# Patient Record
Sex: Male | Born: 1990
Health system: Southern US, Community
[De-identification: ages and names within clinical notes are randomized; demographics above are authoritative.]

## PROBLEM LIST (undated history)

## (undated) DIAGNOSIS — F909 Attention-deficit hyperactivity disorder, unspecified type: Secondary | ICD-10-CM

## (undated) DIAGNOSIS — F329 Major depressive disorder, single episode, unspecified: Secondary | ICD-10-CM

## (undated) DIAGNOSIS — K649 Unspecified hemorrhoids: Secondary | ICD-10-CM

## (undated) DIAGNOSIS — F419 Anxiety disorder, unspecified: Secondary | ICD-10-CM

## (undated) DIAGNOSIS — F32A Depression, unspecified: Secondary | ICD-10-CM

## (undated) HISTORY — DX: Depression, unspecified: F32.A

## (undated) HISTORY — PX: TYMPANOSTOMY TUBE PLACEMENT: SHX32

## (undated) HISTORY — DX: Major depressive disorder, single episode, unspecified: F32.9

## (undated) HISTORY — DX: Anxiety disorder, unspecified: F41.9

## (undated) HISTORY — PX: EYE SURGERY: SHX253

## (undated) HISTORY — PX: ADENOIDECTOMY: SUR15

## (undated) HISTORY — DX: Attention-deficit hyperactivity disorder, unspecified type: F90.9

---

## 2015-12-14 ENCOUNTER — Ambulatory Visit: Payer: Self-pay | Admitting: Family Medicine

## 2015-12-14 DIAGNOSIS — E669 Obesity, unspecified: Secondary | ICD-10-CM | POA: Insufficient documentation

## 2015-12-14 NOTE — Progress Notes (Deleted)
        New patient office visit note:  Impression and Recommendations:    No diagnosis found.   ***  No problem-specific Assessment & Plan notes found for this encounter.      No orders of the defined types were placed in this encounter.   New Prescriptions   No medications on file    Modified Medications   No medications on file    Discontinued Medications   No medications on file    No Follow-up on file.  The patient was counseled, risk factors were discussed, anticipatory guidance given.  Gross side effects, risk and benefits, and alternatives of medications discussed with patient.  Patient is aware that all medications have potential side effects and we are unable to predict every side effect or drug-drug interaction that may occur.  Expresses verbal understanding and consents to current therapy plan and treatment regimen.  Please see AVS handed out to patient at the end of our visit for further patient instructions/ counseling done pertaining to today's office visit.    Note: This document was prepared using Dragon voice recognition software and may include unintentional dictation errors.  ----------------------------------------------------------------------------------------------------------------------    Subjective:    No chief complaint on file.   HPI: Scott Mitchell is a pleasant 25 y.o. male who [facility] today to review their medical history with me and establish care.   I asked the patient to review their chronic problem list with me to ensure everything was updated and accurate.     ***    Wt Readings from Last 3 Encounters:  No data found for Wt   BP Readings from Last 3 Encounters:  No data found for BP   Pulse Readings from Last 3 Encounters:  No data found for Pulse   BMI Readings from Last 3 Encounters:  No data found for BMI    There are no active problems to display for this  patient.    No past medical history on file.   No past surgical history on file.   No family history on file.   History  Drug use: Unknown    History  Alcohol use Not on file    History  Smoking Status  . Not on file  Smokeless Tobacco  . Not on file    Patient's Medications   No medications on file    Allergies: Review of patient's allergies indicates not on file.  ROS   Objective:   There were no vitals taken for this visit. There is no height or weight on file to calculate BMI. General: Well Developed, well nourished, and in no acute distress.  Neuro: Alert and oriented x3, extra-ocular muscles intact, sensation grossly intact.  HEENT: Normocephalic, atraumatic, pupils equal round reactive to light, neck supple Skin: no gross suspicious lesions or rashes  Cardiac: Regular rate and rhythm, no murmurs rubs or gallops.  Respiratory: Essentially clear to auscultation bilaterally. Not using accessory muscles, speaking in full sentences.  Abdominal: Soft, not grossly distended Musculoskeletal: Ambulates w/o diff, FROM * 4 ext.  Vasc: less 2 sec cap RF, warm and pink  Psych:  No HI/SI, judgement and insight good, Euthymic mood. Full Affect.

## 2015-12-26 DIAGNOSIS — F331 Major depressive disorder, recurrent, moderate: Secondary | ICD-10-CM | POA: Insufficient documentation

## 2017-03-23 ENCOUNTER — Encounter (HOSPITAL_BASED_OUTPATIENT_CLINIC_OR_DEPARTMENT_OTHER): Payer: Self-pay | Admitting: Emergency Medicine

## 2017-03-23 ENCOUNTER — Other Ambulatory Visit: Payer: Self-pay

## 2017-03-23 ENCOUNTER — Emergency Department (HOSPITAL_BASED_OUTPATIENT_CLINIC_OR_DEPARTMENT_OTHER)
Admission: EM | Admit: 2017-03-23 | Discharge: 2017-03-23 | Disposition: A | Discharge status: Home / Self Care | Payer: 59 | Attending: Emergency Medicine | Admitting: Emergency Medicine

## 2017-03-23 DIAGNOSIS — K644 Residual hemorrhoidal skin tags: Secondary | ICD-10-CM | POA: Diagnosis not present

## 2017-03-23 DIAGNOSIS — K649 Unspecified hemorrhoids: Secondary | ICD-10-CM | POA: Diagnosis not present

## 2017-03-23 DIAGNOSIS — K6289 Other specified diseases of anus and rectum: Secondary | ICD-10-CM | POA: Diagnosis not present

## 2017-03-23 HISTORY — DX: Unspecified hemorrhoids: K64.9

## 2017-03-23 MED ORDER — IBUPROFEN 800 MG PO TABS
800.0000 mg | ORAL_TABLET | Freq: Three times a day (TID) | ORAL | 0 refills | Status: DC
Start: 2017-03-23 — End: 2021-07-02

## 2017-03-23 MED ORDER — LIDOCAINE-HYDROCORTISONE ACE 3-1 % RE KIT: 1.0000 "application " | PACK | Freq: Two times a day (BID) | RECTAL | 0 refills | Status: DC

## 2017-03-23 NOTE — Discharge Instructions (Signed)
You were seen in the emergency department and found to have a hemorrhoid on your physical exam. We have prescribed you two medications today:   - Anamantle- this is topical cream that has lidocaine and hydrocortisone in it. Lidocaine is a topical numbing medicine. Hydrocortisone is a topical steroid. These medications will help with your discomfort.   - Ibuprofen 800mg - this is a nonsteroidal anti-inflammatory medication will help with pain and swelling.  You may take this once every 8 hours as needed for pain.  Be sure to take this with food as it can cause stomach upset, and at worst stomach bleeding.  I have given you contact information for general surgeon office, call today or tomorrow in order to make an appointment within the next couple of days.  This appointment will be for reevaluation and to see if there is further intervention needed for your hemorrhoid.  To the emergency department for any new or worsening symptoms or any other concerns.

## 2017-03-23 NOTE — ED Provider Notes (Signed)
[facility] EMERGENCY DEPARTMENT Provider Note   CSN: [id number] Arrival date & time: 03/23/17  5726   History   Chief Complaint Chief Complaint  Patient presents with  . Rectal Pain    HPI Scott Mitchell is a 27 y.o. male with a hx of hemorrhoids who presents to the ED with complaint of constant rectal pain for the past 5 days. Pain has been persistent, not necessarily worsening. States that pain is worse following BM, slightly improved with SITZ baths. No other alleviating/aggravating factors. Has hx of similar, has not required surgical intervention. Denies abdominal pain, blood in stool, diarrhea, constipation, nausea, or vomiting. No fever or chills.  HPI  Past Medical History:  Diagnosis Date  . Hemorrhoids     There are no active problems to display for this patient.   History reviewed. No pertinent surgical history.    Home Medications    Prior to Admission medications   Medication Sig Start Date End Date Taking? Authorizing Provider  shark liver oil-cocoa butter (PREPARATION H) 0.25-3-85.5 % suppository Place 1 suppository rectally as needed for hemorrhoids.   Yes [provider]    Family History History reviewed. No pertinent family history.  Social History Social History   Tobacco Use  . Smoking status: Never Smoker  . Smokeless tobacco: Never Used  Substance Use Topics  . Alcohol use: Not on file  . Drug use: Not on file     Allergies   Patient has no known allergies.   Review of Systems Review of Systems  Constitutional: Negative for chills and fever.  Gastrointestinal: Positive for rectal pain. Negative for abdominal pain, blood in stool, constipation, diarrhea, nausea and vomiting.     Physical Exam Updated Vital Signs BP 139/79 (BP Location: Right Arm)   Pulse (!) 56   Temp 98.1 F (36.7 C) (Oral)   Resp 18   Ht 6' (1.829 m)   Wt 106.6 kg (235 lb)   SpO2 100%   BMI 31.87 kg/m   Physical Exam  Constitutional:  He appears well-developed and well-nourished. No distress.  HENT:  Head: Normocephalic and atraumatic.  Eyes: Conjunctivae are normal. Right eye exhibits no discharge. Left eye exhibits no discharge.  Abdominal: Soft. He exhibits no distension. There is no tenderness. There is no rebound and no guarding.  Genitourinary: Rectal exam shows external hemorrhoid (12:00 position that is 1.5cm size, the hemorrhoid is somewhat firm to palaption, flesh colored. There is tenderness to palpation).  Genitourinary Comments: RN Elba Barman present as chaperone.   Neurological: He is alert.  Clear speech.   Skin: Skin is warm and dry.  Psychiatric: He has a normal mood and affect. His behavior is normal. Thought content normal.  Nursing note and vitals reviewed.   ED Treatments / Results  Labs (all labs ordered are listed, but only abnormal results are displayed) Labs Reviewed - No data to display  EKG  EKG Interpretation None      Radiology No results found.  Procedures Procedures (including critical care time)  Medications Ordered in ED Medications - No data to display  Initial Impression / Assessment and Plan / ED Course  I have reviewed the triage vital signs and the nursing notes.  Pertinent labs & imaging results that were available during my care of the patient were reviewed by me and considered in my medical decision making (see chart for details).  Patient presents with hemorrhoid on exam. He is nontoxic appearing, vitals WNL. Hemorrhoid is tender to  palpation, there is concern for thrombosis. Will treat with topical hydrocortisone/lidocaine as well as ibuprofen. Discussed continued Sitz baths. Instructed patient to call general surgery today or tomorrow to make an appointment within the next 48 hours for re-evaluation and further management. I discussed treatment plan, need for close general surgery follow-up, and return precautions with the patient. Provided opportunity for questions,  patient confirmed understanding and is in agreement with plan.   Findings and plan of care discussed with supervising physician Dr. Sherry Ruffing who is in agreement.   Final Clinical Impressions(s) / ED Diagnoses   Final diagnoses:  Hemorrhoids, unspecified hemorrhoid type    ED Discharge Orders        Ordered    lidocaine-hydrocortisone (ANAMANTLE) 3-1 % KIT  2 times daily     03/23/17 1525    ibuprofen (ADVIL,MOTRIN) 800 MG tablet  3 times daily     03/23/17 [address] [place] R, PA-C 03/23/17 1842    Tegeler, Gwenyth Allegra, MD 03/23/17 [phone]

## 2017-03-23 NOTE — ED Triage Notes (Signed)
Pt having rectal pain since last Wednesday.  Pt states he has hemorrhoids in the past.  Pt using OTC meds, ice, sitz baths but is not improving.  No fever, no drainage.

## 2017-03-30 DIAGNOSIS — K645 Perianal venous thrombosis: Secondary | ICD-10-CM | POA: Diagnosis not present

## 2017-04-07 ENCOUNTER — Ambulatory Visit (INDEPENDENT_AMBULATORY_CARE_PROVIDER_SITE_OTHER): Payer: 59 | Admitting: Adult Health

## 2017-04-07 ENCOUNTER — Encounter: Payer: Self-pay | Admitting: Adult Health

## 2017-04-07 VITALS — BP 127/84 | HR 64 | Ht 71.5 in | Wt 238.6 lb

## 2017-04-07 DIAGNOSIS — Z8349 Family history of other endocrine, nutritional and metabolic diseases: Secondary | ICD-10-CM | POA: Diagnosis not present

## 2017-04-07 DIAGNOSIS — F419 Anxiety disorder, unspecified: Secondary | ICD-10-CM | POA: Diagnosis not present

## 2017-04-07 DIAGNOSIS — F329 Major depressive disorder, single episode, unspecified: Secondary | ICD-10-CM | POA: Diagnosis not present

## 2017-04-07 DIAGNOSIS — R5383 Other fatigue: Secondary | ICD-10-CM | POA: Diagnosis not present

## 2017-04-07 DIAGNOSIS — F32A Depression, unspecified: Secondary | ICD-10-CM | POA: Insufficient documentation

## 2017-04-07 DIAGNOSIS — Z0000 Encounter for general adult medical examination without abnormal findings: Secondary | ICD-10-CM | POA: Insufficient documentation

## 2017-04-07 NOTE — Assessment & Plan Note (Signed)
PHQ 14 Previously on Sertraline 50mg , he stopped taking Fall 2018 He declined CBT referral or re-starting Rx He reports mood stable, denies thoughts of harming himself/others.

## 2017-04-07 NOTE — Assessment & Plan Note (Signed)
Increase water intake, strive for at least 125 ounces/day.   Follow Heart Healthy diet Increase regular exercise.  Recommend at least 30 minutes daily, 5 days per week of walking, jogging, biking, swimming, YouTube/Pinterest workout videos. Your homework- Hit the kick boxing gym with your co-worker by 15 Feb 19 Meet with [employer] recruiter by 1 Mar 19 Schedule complete physical in the next few months.

## 2017-04-07 NOTE — Patient Instructions (Addendum)
Heart-Healthy Eating Plan Many factors influence your heart health, including eating and exercise habits. Heart (coronary) risk increases with abnormal blood fat (lipid) levels. Heart-healthy meal planning includes limiting unhealthy fats, increasing healthy fats, and making other small dietary changes. This includes maintaining a healthy body weight to help keep lipid levels within a normal range. What is my plan? Your health care provider recommends that you:  Get no more than __25__% of the total calories in your daily diet from fat.  Limit your intake of saturated fat to less than ___5___% of your total calories each day.  Limit the amount of cholesterol in your diet to less than _300__ mg per day.  What types of fat should I choose?  Choose healthy fats more often. Choose monounsaturated and polyunsaturated fats, such as olive oil and canola oil, flaxseeds, walnuts, almonds, and seeds.  Eat more omega-3 fats. Good choices include salmon, mackerel, sardines, tuna, flaxseed oil, and ground flaxseeds. Aim to eat fish at least two times each week.  Limit saturated fats. Saturated fats are primarily found in animal products, such as meats, butter, and cream. Plant sources of saturated fats include palm oil, palm kernel oil, and coconut oil.  Avoid foods with partially hydrogenated oils in them. These contain trans fats. Examples of foods that contain trans fats are stick margarine, some tub margarines, cookies, crackers, and other baked goods. What general guidelines do I need to follow?  Check food labels carefully to identify foods with trans fats or high amounts of saturated fat.  Fill one half of your plate with vegetables and green salads. Eat 4-5 servings of vegetables per day. A serving of vegetables equals 1 cup of raw leafy vegetables,  cup of raw or cooked cut-up vegetables, or  cup of vegetable juice.  Fill one fourth of your plate with whole grains. Look for the word "whole"  as the first word in the ingredient list.  Fill one fourth of your plate with lean protein foods.  Eat 4-5 servings of fruit per day. A serving of fruit equals one medium whole fruit,  cup of dried fruit,  cup of fresh, frozen, or canned fruit, or  cup of 100% fruit juice.  Eat more foods that contain soluble fiber. Examples of foods that contain this type of fiber are apples, broccoli, carrots, beans, peas, and barley. Aim to get 20-30 g of fiber per day.  Eat more home-cooked food and less restaurant, buffet, and fast food.  Limit or avoid alcohol.  Limit foods that are high in starch and sugar.  Avoid fried foods.  Cook foods by using methods other than frying. Baking, boiling, grilling, and broiling are all great options. Other fat-reducing suggestions include: ? Removing the skin from poultry. ? Removing all visible fats from meats. ? Skimming the fat off of stews, soups, and gravies before serving them. ? Steaming vegetables in water or broth.  Lose weight if you are overweight. Losing just 5-10% of your initial body weight can help your overall health and prevent diseases such as diabetes and heart disease.  Increase your consumption of nuts, legumes, and seeds to 4-5 servings per week. One serving of dried beans or legumes equals  cup after being cooked, one serving of nuts equals 1 ounces, and one serving of seeds equals  ounce or 1 tablespoon.  You may need to monitor your salt (sodium) intake, especially if you have high blood pressure. Talk with your health care provider or dietitian to get  more information about reducing sodium. What foods can I eat? Grains  Breads, including [place], white, pita, wheat, raisin, rye, oatmeal, and [place]. Tortillas that are neither fried nor made with lard or trans fat. Low-fat rolls, including hotdog and hamburger buns and English muffins. Biscuits. Muffins. Waffles. Pancakes. Light popcorn. Whole-grain cereals. Flatbread. Melba  toast. Pretzels. Breadsticks. Rusks. Low-fat snacks and crackers, including oyster, saltine, matzo, graham, animal, and rye. Rice and pasta, including brown rice and those that are made with whole wheat. Vegetables All vegetables. Fruits All fruits, but limit coconut. Meats and Other Protein Sources Lean, well-trimmed beef, veal, pork, and lamb. Chicken and [place] without skin. All fish and shellfish. Wild duck, rabbit, pheasant, and venison. Egg whites or low-cholesterol egg substitutes. Dried beans, peas, lentils, and tofu.Seeds and most nuts. Dairy Low-fat or nonfat cheeses, including ricotta, string, and mozzarella. Skim or 1% milk that is liquid, powdered, or evaporated. Buttermilk that is made with low-fat milk. Nonfat or low-fat yogurt. Beverages Mineral water. Diet carbonated beverages. Sweets and Desserts Sherbets and fruit ices. Honey, jam, marmalade, jelly, and syrups. Meringues and gelatins. Pure sugar candy, such as hard candy, jelly beans, gumdrops, mints, marshmallows, and small amounts of dark chocolate. [employer] Eat all sweets and desserts in moderation. Fats and Oils Nonhydrogenated (trans-free) margarines. Vegetable oils, including soybean, sesame, sunflower, olive, peanut, safflower, corn, canola, and cottonseed. Salad dressings or mayonnaise that are made with a vegetable oil. Limit added fats and oils that you use for cooking, baking, salads, and as spreads. Other Cocoa powder. Coffee and tea. All seasonings and condiments. The items listed above may not be a complete list of recommended foods or beverages. Contact your dietitian for more options. What foods are not recommended? Grains Breads that are made with saturated or trans fats, oils, or whole milk. Croissants. Butter rolls. Cheese breads. Sweet rolls. Donuts. Buttered popcorn. Chow mein noodles. High-fat crackers, such as cheese or butter crackers. Meats and Other Protein Sources Fatty meats, such as  hotdogs, short ribs, sausage, spareribs, bacon, ribeye roast or steak, and mutton. High-fat deli meats, such as salami and bologna. Caviar. Domestic duck and goose. Organ meats, such as kidney, liver, sweetbreads, brains, gizzard, chitterlings, and heart. Dairy Cream, sour cream, cream cheese, and creamed cottage cheese. Whole milk cheeses, including blue (bleu), Monterey Jack, [place], [place], American, [place], Swiss, [place], [place], and [place]. Whole or 2% milk that is liquid, evaporated, or condensed. Whole buttermilk. Cream sauce or high-fat cheese sauce. Yogurt that is made from whole milk. Beverages Regular sodas and drinks with added sugar. Sweets and Desserts Frosting. Pudding. Cookies. Cakes other than angel food cake. Candy that has milk chocolate or white chocolate, hydrogenated fat, butter, coconut, or unknown ingredients. Buttered syrups. Full-fat ice cream or ice cream drinks. Fats and Oils Gravy that has suet, meat fat, or shortening. Cocoa butter, hydrogenated oils, palm oil, coconut oil, palm kernel oil. These can often be found in baked products, candy, fried foods, nondairy creamers, and whipped toppings. Solid fats and shortenings, including bacon fat, salt pork, lard, and butter. Nondairy cream substitutes, such as coffee creamers and sour cream substitutes. Salad dressings that are made of unknown oils, cheese, or sour cream. The items listed above may not be a complete list of foods and beverages to avoid. Contact your dietitian for more information. This information is not intended to replace advice given to you by your health care provider. Make sure you discuss any questions you have with your health care  provider. Document Released: 12/11/2007 Document Revised: 09/21/2015 Document Reviewed: 08/25/2013 Elsevier Interactive Patient Education  2018 [employer]  Increase water intake, strive for at least 125 ounces/day.   Follow Heart Healthy diet Increase regular  exercise.  Recommend at least 30 minutes daily, 5 days per week of walking, jogging, biking, swimming, YouTube/Pinterest workout videos. Your homework- Hit the kick boxing gym with your co-worker by 15 Feb 19 Meet with [employer] recruiter by 1 Mar 19 Schedule complete physical in the next few months. WELCOME TO THE [facility]CRICE

## 2017-04-07 NOTE — Progress Notes (Signed)
Subjective:    Patient ID: Scott Mitchell, male    DOB: 1990/03/20, 27 y.o.   MRN: [id number]  HPI:  Scott Mitchell is here to establish as a new pt. He is a pleasant 27 year old male. PMH: Depression/anxiety, PHQ 14 today. He was briefly on Sertraline 88m last year for approx 6 months, last dose was fall 2018. He denies thoughts of harming himself/others and declined CBT referral or re-starting Rx today. He denies thoughts of harming himself/others. He moved [place] 3 years ago and works for [employer] has very few local friends and no family. He feels quite isolated and spends most evenings at home with his new puppy. He denies tobacco/excessive ETOH, he also reports occasional use of marijuana. He has not exercised in months, but he has previously enjoyed road biking, weight lifting, and kick boxing. He reports "I'm all in or nothing" when it comes to diet/exercise. He estimates to have gained 35-40 lbs in the last 1.5 years. He is interested joining [employer] Patient Care Team    Relationship Specialty Notifications Start End  Gibran Veselka, KBerna Spare NP PCP - General Family Medicine  03/19/17     There are no active problems to display for this patient.    Past Medical History:  Diagnosis Date  . Depression   . Hemorrhoids      Past Surgical History:  Procedure Laterality Date  . ADENOIDECTOMY    . EYE SURGERY       Family History  Problem Relation Age of Onset  . Hypothyroidism Mother   . Hypertension Father   . Healthy Sister      Social History   Substance and Sexual Activity  Drug Use Yes  . Types: Marijuana     Social History   Substance and Sexual Activity  Alcohol Use No  . Frequency: Never     Social History   Tobacco Use  Smoking Status Never Smoker  Smokeless Tobacco Never Used     Outpatient Encounter Medications as of 04/07/2017  Medication Sig  . ibuprofen (ADVIL,MOTRIN) 800 MG tablet Take 1 tablet (800 mg total) by mouth 3 (three) times daily.   . [DISCONTINUED] lidocaine-hydrocortisone (ANAMANTLE) 3-1 % KIT Place 1 application rectally 2 (two) times daily.  . [DISCONTINUED] shark liver oil-cocoa butter (PREPARATION H) 0.25-3-85.5 % suppository Place 1 suppository rectally as needed for hemorrhoids.   No facility-administered encounter medications on file as of 04/07/2017.     Allergies: Patient has no known allergies.  Body mass index is 32.81 kg/m.  Blood pressure 127/84, pulse 64, height 5' 11.5" (1.816 m), weight 238 lb 9.6 oz (108.2 kg), SpO2 99 %.     Review of Systems  Constitutional: Positive for fatigue. Negative for activity change, appetite change, chills, diaphoresis, fever and unexpected weight change.  HENT: Negative for congestion.   Eyes: Negative for visual disturbance.  Respiratory: Negative for cough, chest tightness, shortness of breath, wheezing and stridor.   Cardiovascular: Negative for chest pain, palpitations and leg swelling.  Gastrointestinal: Negative for abdominal distention, abdominal pain, blood in stool, constipation, diarrhea, nausea and vomiting.  Endocrine: Negative for cold intolerance, heat intolerance, polydipsia, polyphagia and polyuria.  Genitourinary: Negative for difficulty urinating, flank pain and hematuria.  Musculoskeletal: Negative for arthralgias, back pain, gait problem, joint swelling, myalgias, neck pain and neck stiffness.  Skin: Negative for color change, pallor, rash and wound.  Neurological: Negative for dizziness and headaches.  Hematological: Does not bruise/bleed easily.  Psychiatric/Behavioral: Positive for  sleep disturbance. Negative for decreased concentration, dysphoric mood, hallucinations, self-injury and suicidal ideas. The patient is not nervous/anxious and is not hyperactive.        Objective:   Physical Exam  Constitutional: He is oriented to person, place, and time. He appears well-developed and well-nourished. No distress.  Cardiovascular: Normal  rate, regular rhythm, normal heart sounds and intact distal pulses.  No murmur heard. Pulmonary/Chest: Effort normal and breath sounds normal. No respiratory distress. He has no wheezes. He has no rales. He exhibits no tenderness.  Neurological: He is alert and oriented to person, place, and time.  Skin: Skin is warm and dry. No rash noted. He is not diaphoretic. No erythema. No pallor.  Psychiatric: He has a normal mood and affect. His behavior is normal. Judgment and thought content normal.  Nursing note and vitals reviewed.         Assessment & Plan:   1. Other fatigue   2. Family history of thyroid disease   3. Healthcare maintenance   4. Anxiety and depression     Healthcare maintenance Increase water intake, strive for at least 125 ounces/day.   Follow Heart Healthy diet Increase regular exercise.  Recommend at least 30 minutes daily, 5 days per week of walking, jogging, biking, swimming, YouTube/Pinterest workout videos. Your homework- Hit the kick boxing gym with your co-worker by 15 Feb 19 Meet with [employer] recruiter by 1 Mar 19 Schedule complete physical in the next few months.  Anxiety and depression PHQ 14 Previously on Sertraline 21m, he stopped taking Fall 2018 He declined CBT referral or re-starting Rx He reports mood stable, denies thoughts of harming himself/others.     FOLLOW-UP:  Return in about 3 months (around 07/06/2017) for CPE.

## 2017-04-08 LAB — COMPREHENSIVE METABOLIC PANEL
A/G RATIO: 2.1 (ref 1.2–2.2)
ALT: 32 IU/L (ref 0–44)
AST: 21 IU/L (ref 0–40)
Albumin: 5 g/dL (ref 3.5–5.5)
Alkaline Phosphatase: 41 IU/L (ref 39–117)
BUN/Creatinine Ratio: 14 (ref 9–20)
BUN: 16 mg/dL (ref 6–20)
Bilirubin Total: 0.4 mg/dL (ref 0.0–1.2)
CALCIUM: 9.3 mg/dL (ref 8.7–10.2)
CO2: 28 mmol/L (ref 20–29)
CREATININE: 1.17 mg/dL (ref 0.76–1.27)
Chloride: 101 mmol/L (ref 96–106)
GFR, EST AFRICAN AMERICAN: 98 mL/min/{1.73_m2} (ref >59)
GFR, EST NON AFRICAN AMERICAN: 84 mL/min/{1.73_m2} (ref >59)
GLOBULIN, TOTAL: 2.4 g/dL (ref 1.5–4.5)
Glucose: 90 mg/dL (ref 65–99)
POTASSIUM: 4.1 mmol/L (ref 3.5–5.2)
SODIUM: 144 mmol/L (ref 134–144)
TOTAL PROTEIN: 7.4 g/dL (ref 6.0–8.5)

## 2017-04-08 LAB — CBC WITH DIFFERENTIAL/PLATELET
Basophils Absolute: 0 10*3/uL (ref 0.0–0.2)
Basos: 1 %
EOS (ABSOLUTE): 0.2 10*3/uL (ref 0.0–0.4)
EOS: 3 %
HEMATOCRIT: 42.9 % (ref 37.5–51.0)
HEMOGLOBIN: 15 g/dL (ref 13.0–17.7)
IMMATURE GRANS (ABS): 0 10*3/uL (ref 0.0–0.1)
IMMATURE GRANULOCYTES: 0 %
LYMPHS: 29 %
Lymphocytes Absolute: 1.9 10*3/uL (ref 0.7–3.1)
MCH: 29.9 pg (ref 26.6–33.0)
MCHC: 35 g/dL (ref 31.5–35.7)
MCV: 86 fL (ref 79–97)
MONOCYTES: 6 %
Monocytes Absolute: 0.4 10*3/uL (ref 0.1–0.9)
NEUTROS PCT: 61 %
Neutrophils Absolute: 3.8 10*3/uL (ref 1.4–7.0)
Platelets: 152 10*3/uL (ref 150–379)
RBC: 5.02 x10E6/uL (ref 4.14–5.80)
RDW: 13.6 % (ref 12.3–15.4)
WBC: 6.3 10*3/uL (ref 3.4–10.8)

## 2017-04-08 LAB — LIPID PANEL
CHOL/HDL RATIO: 4.1 ratio (ref 0.0–5.0)
CHOLESTEROL TOTAL: 160 mg/dL (ref 100–199)
HDL: 39 mg/dL — AB (ref >39)
LDL CALC: 101 mg/dL — AB (ref 0–99)
TRIGLYCERIDES: 98 mg/dL (ref 0–149)
VLDL Cholesterol Cal: 20 mg/dL (ref 5–40)

## 2017-04-08 LAB — HEMOGLOBIN A1C
Est. average glucose Bld gHb Est-mCnc: 103 mg/dL
Hgb A1c MFr Bld: 5.2 % (ref 4.8–5.6)

## 2017-04-08 LAB — TSH: TSH: 2.25 u[IU]/mL (ref 0.450–4.500)

## 2017-04-08 LAB — VITAMIN D 25 HYDROXY (VIT D DEFICIENCY, FRACTURES): VIT D 25 HYDROXY: 23.7 ng/mL — AB (ref 30.0–100.0)

## 2017-04-09 ENCOUNTER — Telehealth: Payer: Self-pay | Admitting: Adult Health

## 2017-04-09 ENCOUNTER — Other Ambulatory Visit: Payer: Self-pay | Admitting: Adult Health

## 2017-04-09 DIAGNOSIS — F439 Reaction to severe stress, unspecified: Secondary | ICD-10-CM

## 2017-04-09 NOTE — Telephone Encounter (Signed)
Patient is requesting a referral to see [facility]al [facility] ([facility] Their contact number is [phone] if needed.

## 2017-04-09 NOTE — Telephone Encounter (Signed)
Routing to PCP

## 2017-04-09 NOTE — Telephone Encounter (Signed)
Good Afternoon Marylene LandAngela, I sent in mental health referral, re: stress. Thanks! Orpha BurKaty

## 2017-05-04 ENCOUNTER — Ambulatory Visit (INDEPENDENT_AMBULATORY_CARE_PROVIDER_SITE_OTHER): Payer: 59 | Admitting: Adult Health

## 2017-05-04 ENCOUNTER — Encounter: Payer: Self-pay | Admitting: Adult Health

## 2017-05-04 VITALS — BP 135/75 | HR 76 | Temp 98.6°F | Ht 71.5 in | Wt 247.7 lb

## 2017-05-04 DIAGNOSIS — F329 Major depressive disorder, single episode, unspecified: Secondary | ICD-10-CM

## 2017-05-04 DIAGNOSIS — G43909 Migraine, unspecified, not intractable, without status migrainosus: Secondary | ICD-10-CM | POA: Insufficient documentation

## 2017-05-04 DIAGNOSIS — M542 Cervicalgia: Secondary | ICD-10-CM | POA: Diagnosis not present

## 2017-05-04 DIAGNOSIS — R519 Headache, unspecified: Secondary | ICD-10-CM

## 2017-05-04 DIAGNOSIS — R51 Headache: Secondary | ICD-10-CM | POA: Diagnosis not present

## 2017-05-04 DIAGNOSIS — Z0000 Encounter for general adult medical examination without abnormal findings: Secondary | ICD-10-CM

## 2017-05-04 DIAGNOSIS — F419 Anxiety disorder, unspecified: Secondary | ICD-10-CM | POA: Diagnosis not present

## 2017-05-04 HISTORY — DX: Cervicalgia: M54.2

## 2017-05-04 MED ORDER — CYCLOBENZAPRINE HCL 10 MG PO TABS: 10.0000 mg | ORAL_TABLET | Freq: Every day | ORAL | 0 refills | Status: DC

## 2017-05-04 NOTE — Progress Notes (Signed)
Subjective:    Patient ID: Scott Mitchell, male    DOB: [birth year], 27 y.o.   MRN: [id number]  HPI:  Mr. Aker presents with constant HA that is localized over bil parietal/frontal head and rated 6/10, decribed as "burning/pressure". HA has been present > 3weeks and will worsen as the day progresses He denies CP/dyspnea/palpitations.  He has been experiencing daily dizziness with nausea without vomiting when driving or watching TV/playing video games. He denies recent falls or hx of head trauma (despite participating in kick boxing for years- he denies any injury/LOC). He stopped using THC daily 3 weeks ago, after >3 years of heavy daily use. He has also reduced caffeine intake from 5-6 cups coffee day to 1-2 cup/day. He has started swimming twice a week and estimates to drink 1/2- 1 gallon water/day He has been called by therapist and plans on making initial appt in week/or so. He denies thoughts of harming himself/others and feels that his anxiety/depression is stable. He continue to struggle making friends locally. He also reports cervical neck stiffness with spasm that started > 2 weeks ago He reports going to sleep at 1700 and waking at 0200 and going to work to complete 8 hr shift His company does not mind him shifting his work hours Reviewed recent labs at [facility] [employer]  Patient Care Team    Relationship Specialty Notifications Start End  Kona Yusuf, Jinny Blossom, NP PCP - General Family Medicine  03/19/17     Patient Active Problem List   Diagnosis Date Noted  . Cervical pain (neck) 05/04/2017  . Acute intractable headache 05/04/2017  . Healthcare maintenance 04/07/2017  . Anxiety and depression 04/07/2017     Past Medical History:  Diagnosis Date  . Depression   . Hemorrhoids      Past Surgical History:  Procedure Laterality Date  . ADENOIDECTOMY    . EYE SURGERY       Family History  Problem Relation Age of Onset  . Hypothyroidism Mother   . Hypertension Father   . Healthy  Sister      Social History   Substance and Sexual Activity  Drug Use Yes  . Types: Marijuana   Comment: Per pt DC'd approximately 04/13/17     Social History   Substance and Sexual Activity  Alcohol Use No  . Frequency: Never     Social History   Tobacco Use  Smoking Status Never Smoker  Smokeless Tobacco Never Used     Outpatient Encounter Medications as of 05/04/2017  Medication Sig  . ibuprofen (ADVIL,MOTRIN) 800 MG tablet Take 1 tablet (800 mg total) by mouth 3 (three) times daily.  . cyclobenzaprine (FLEXERIL) 10 MG tablet Take 1 tablet (10 mg total) by mouth at bedtime.   No facility-administered encounter medications on file as of 05/04/2017.     Allergies: Patient has no known allergies.  Body mass index is 34.07 kg/m.  Blood pressure 135/75, pulse 76, temperature 98.6 F (37 C), temperature source Oral, height 5' 11.5" (1.816 m), weight 247 lb 11.2 oz (112.4 kg), SpO2 99 %.    Review of Systems  Constitutional: Positive for fatigue. Negative for activity change, appetite change, chills, diaphoresis, fever and unexpected weight change.  HENT: Negative for congestion.   Eyes: Negative for visual disturbance.  Respiratory: Negative for cough, chest tightness, shortness of breath, wheezing and stridor.   Cardiovascular: Negative for chest pain, palpitations and leg swelling.  Gastrointestinal: Negative for abdominal distention, abdominal pain, blood in stool, constipation, diarrhea,  nausea and vomiting.  Endocrine: Negative for cold intolerance, heat intolerance, polydipsia, polyphagia and polyuria.  Genitourinary: Negative for difficulty urinating and flank pain.  Musculoskeletal: Positive for neck stiffness. Negative for arthralgias, back pain, gait problem, joint swelling, myalgias and neck pain.  Skin: Negative for color change, pallor and rash.  Neurological: Positive for dizziness and headaches. Negative for tremors, seizures, speech difficulty and  weakness.  Hematological: Does not bruise/bleed easily.  Psychiatric/Behavioral: Positive for dysphoric mood. Negative for confusion, decreased concentration, hallucinations, self-injury, sleep disturbance and suicidal ideas. The patient is nervous/anxious. The patient is not hyperactive.        Objective:   Physical Exam  Constitutional: He is oriented to person, place, and time. He appears well-developed and well-nourished. No distress.  HENT:  Head: Normocephalic and atraumatic.  Right Ear: Hearing, tympanic membrane, external ear and ear canal normal. Tympanic membrane is not perforated and not bulging. No decreased hearing is noted.  Left Ear: Hearing, tympanic membrane, external ear and ear canal normal. Tympanic membrane is not perforated and not bulging. No decreased hearing is noted.  Nose: Nose normal. Right sinus exhibits no maxillary sinus tenderness and no frontal sinus tenderness. Left sinus exhibits no maxillary sinus tenderness and no frontal sinus tenderness.  Mouth/Throat: Uvula is midline, oropharynx is clear and moist and mucous membranes are normal.  Eyes: Conjunctivae are normal. Pupils are equal, round, and reactive to light.  Neck: Normal range of motion. Neck supple.  Cardiovascular: Normal rate, regular rhythm, normal heart sounds and intact distal pulses.  No murmur heard. Pulmonary/Chest: Effort normal and breath sounds normal. No respiratory distress. He has no wheezes. He has no rales. He exhibits no tenderness.  Musculoskeletal:       Left shoulder: Normal.       Cervical back: He exhibits tenderness and spasm.       Thoracic back: Normal.  Lymphadenopathy:    He has no cervical adenopathy.  Neurological: He is oriented to person, place, and time. He displays normal reflexes. No cranial nerve deficit. He exhibits normal muscle tone. Coordination normal.  Skin: Skin is warm and dry. No rash noted. He is not diaphoretic. No erythema. No pallor.  Psychiatric:  He has a normal mood and affect. His behavior is normal. Judgment and thought content normal.  Nursing note and vitals reviewed.         Assessment & Plan:   1. Acute intractable headache, unspecified headache type   2. Anxiety and depression   3. Cervical pain (neck)   4. Healthcare maintenance     Acute intractable headache MRI WO contrast ordered Continue excellent water intake. Alternate OTC Acetaminophen and Ibuprofen for pain control. Perform neck exercises and use Cyclobenzaprine as directed. Please send MyChart message in two weeks and let me know how headache and neck stiffness has been. Please make office appt as needed.  Anxiety and depression Make appt with therapist. Continue to avoid THC use.  Healthcare maintenance Sx's may r/t to withdraw from caffeine and regular THC use VSS, continue to avoid THC and reduce daily caffeine intake Continue excellent water intake. Alternate OTC Acetaminophen and Ibuprofen for pain control. MRI head without contrast ordered, will call you when results are available. Perform neck exercises and use Cyclobenzaprine as directed. Make appt with therapist. Please send MyChart message in two weeks and let me know how headache and neck stiffness has been. Please make office appt as needed.    FOLLOW-UP:  Return in about 2 weeks (  around 05/18/2017), or if symptoms worsen or fail to improve, for [employer].

## 2017-05-04 NOTE — Assessment & Plan Note (Signed)
MRI WO contrast ordered Continue excellent water intake. Alternate OTC Acetaminophen and Ibuprofen for pain control. Perform neck exercises and use Cyclobenzaprine as directed. Please send MyChart message in two weeks and let me know how headache and neck stiffness has been. Please make office appt as needed.

## 2017-05-04 NOTE — Assessment & Plan Note (Signed)
Make appt with therapist. Continue to avoid THC use.

## 2017-05-04 NOTE — Assessment & Plan Note (Addendum)
Sx's may r/t to withdraw from caffeine and regular THC use VSS, continue to avoid THC and reduce daily caffeine intake Continue excellent water intake. Alternate OTC Acetaminophen and Ibuprofen for pain control. MRI head without contrast ordered, will call you when results are available. Perform neck exercises and use Cyclobenzaprine as directed. Make appt with therapist. Please send MyChart message in two weeks and let me know how headache and neck stiffness has been. Please make office appt as needed.

## 2017-05-04 NOTE — Patient Instructions (Signed)
General Headache Without Cause A headache is pain or discomfort felt around the head or neck area. There are many causes and types of headaches. In some cases, the cause may not be found. Follow these instructions at home: Managing pain  Take over-the-counter and prescription medicines only as told by your doctor.  Lie down in a dark, quiet room when you have a headache.  If directed, apply ice to the head and neck area: ? Put ice in a plastic bag. ? Place a towel between your skin and the bag. ? Leave the ice on for 20 minutes, 2-3 times per day.  Use a heating pad or hot shower to apply heat to the head and neck area as told by your doctor.  Keep lights dim if bright lights bother you or make your headaches worse. Eating and drinking  Eat meals on a regular schedule.  Lessen how much alcohol you drink.  Lessen how much caffeine you drink, or stop drinking caffeine. General instructions  Keep all follow-up visits as told by your doctor. This is important.  Keep a journal to find out if certain things bring on headaches. For example, write down: ? What you eat and drink. ? How much sleep you get. ? Any change to your diet or medicines.  Relax by getting a massage or doing other relaxing activities.  Lessen stress.  Sit up straight. Do not tighten (tense) your muscles.  Do not use tobacco products. This includes cigarettes, chewing tobacco, or e-cigarettes. If you need help quitting, ask your doctor.  Exercise regularly as told by your doctor.  Get enough sleep. This often means 7-9 hours of sleep. Contact a doctor if:  Your symptoms are not helped by medicine.  You have a headache that feels different than the other headaches.  You feel sick to your stomach (nauseous) or you throw up (vomit).  You have a fever. Get help right away if:  Your headache becomes really bad.  You keep throwing up.  You have a stiff neck.  You have trouble seeing.  You have  trouble speaking.  You have pain in the eye or ear.  Your muscles are weak or you lose muscle control.  You lose your balance or have trouble walking.  You feel like you will pass out (faint) or you pass out.  You have confusion. This information is not intended to replace advice given to you by your health care provider. Make sure you discuss any questions you have with your health care provider. Document Released: 12/11/2007 Document Revised: 08/09/2015 Document Reviewed: 06/26/2014 Elsevier Interactive Patient Education  2018 [employer] Inc.   Neck Exercises Neck exercises can be important for many reasons:  They can help you to improve and maintain flexibility in your neck. This can be especially important as you age.  They can help to make your neck stronger. This can make movement easier.  They can reduce or prevent neck pain.  They may help your upper back.  Ask your health care provider which neck exercises would be best for you. Exercises Neck Press Repeat this exercise 10 times. Do it first thing in the morning and right before bed or as told by your health care provider. 1. Lie on your back on a firm bed or on the floor with a pillow under your head. 2. Use your neck muscles to push your head down on the pillow and straighten your spine. 3. Hold the position as well as you can.  Keep your head facing up and your chin tucked. 4. Slowly count to 5 while holding this position. 5. Relax for a few seconds. Then repeat.  Isometric Strengthening Do a full set of these exercises 2 times a day or as told by your health care provider. 1. Sit in a supportive chair and place your hand on your forehead. 2. Push forward with your head and neck while pushing back with your hand. Hold for 10 seconds. 3. Relax. Then repeat the exercise 3 times. 4. Next, do thesequence again, this time putting your hand against the back of your head. Use your head and neck to push backward against  the hand pressure. 5. Finally, do the same exercise on either side of your head, pushing sideways against the pressure of your hand.  Prone Head Lifts Repeat this exercise 5 times. Do this 2 times a day or as told by your health care provider. 1. Lie face-down, resting on your elbows so that your chest and upper back are raised. 2. Start with your head facing downward, near your chest. Position your chin either on or near your chest. 3. Slowly lift your head upward. Lift until you are looking straight ahead. Then continue lifting your head as far back as you can stretch. 4. Hold your head up for 5 seconds. Then slowly lower it to your starting position.  Supine Head Lifts Repeat this exercise 8-10 times. Do this 2 times a day or as told by your health care provider. 1. Lie on your back, bending your knees to point to the ceiling and keeping your feet flat on the floor. 2. Lift your head slowly off the floor, raising your chin toward your chest. 3. Hold for 5 seconds. 4. Relax and repeat.  Scapular Retraction Repeat this exercise 5 times. Do this 2 times a day or as told by your health care provider. 1. Stand with your arms at your sides. Look straight ahead. 2. Slowly pull both shoulders backward and downward until you feel a stretch between your shoulder blades in your upper back. 3. Hold for 10-30 seconds. 4. Relax and repeat.  Contact a health care provider if:  Your neck pain or discomfort gets much worse when you do an exercise.  Your neck pain or discomfort does not improve within 2 hours after you exercise. If you have any of these problems, stop exercising right away. Do not do the exercises again unless your health care provider says that you can. Get help right away if:  You develop sudden, severe neck pain. If this happens, stop exercising right away. Do not do the exercises again unless your health care provider says that you can. Exercises Neck Stretch  Repeat this  exercise 3-5 times. 1. Do this exercise while standing or while sitting in a chair. 2. Place your feet flat on the floor, shoulder-width apart. 3. Slowly turn your head to the right. Turn it all the way to the right so you can look over your right shoulder. Do not tilt or tip your head. 4. Hold this position for 10-30 seconds. 5. Slowly turn your head to the left, to look over your left shoulder. 6. Hold this position for 10-30 seconds.  Neck Retraction Repeat this exercise 8-10 times. Do this 3-4 times a day or as told by your health care provider. 1. Do this exercise while standing or while sitting in a sturdy chair. 2. Look straight ahead. Do not bend your neck. 3. Use your fingers  to push your chin backward. Do not bend your neck for this movement. Continue to face straight ahead. If you are doing the exercise properly, you will feel a slight sensation in your throat and a stretch at the back of your neck. 4. Hold the stretch for 1-2 seconds. Relax and repeat.  This information is not intended to replace advice given to you by your health care provider. Make sure you discuss any questions you have with your health care provider. Document Released: 02/12/2015 Document Revised: 08/09/2015 Document Reviewed: 09/11/2014 Elsevier Interactive Patient Education  2018 [employer]  Continue excellent water intake. Alternate OTC Acetaminophen and Ibuprofen for pain control. MRI head without contrast ordered, will call you when results are available. Perform neck exercises and use Cyclobenzaprine as directed. Make appt with therapist. Please send MyChart message in two weeks and let me know how headache and neck stiffness has been. Please make office appt as needed. NICE TO SEE YOU!

## 2017-05-09 ENCOUNTER — Ambulatory Visit
Admission: RE | Admit: 2017-05-09 | Discharge: 2017-05-09 | Disposition: A | Discharge status: Home / Self Care | Payer: 59 | Source: Ambulatory Visit | Attending: Adult Health | Admitting: Adult Health

## 2017-05-09 DIAGNOSIS — R42 Dizziness and giddiness: Secondary | ICD-10-CM | POA: Diagnosis not present

## 2017-05-09 DIAGNOSIS — R519 Headache, unspecified: Secondary | ICD-10-CM

## 2017-05-09 DIAGNOSIS — R51 Headache: Secondary | ICD-10-CM | POA: Diagnosis not present

## 2017-05-30 ENCOUNTER — Encounter: Payer: Self-pay | Admitting: Adult Health

## 2017-06-01 ENCOUNTER — Other Ambulatory Visit: Payer: Self-pay | Admitting: Adult Health

## 2017-06-01 MED ORDER — ONDANSETRON 8 MG PO TBDP: 8.0000 mg | ORAL_TABLET | Freq: Three times a day (TID) | ORAL | 0 refills | Status: DC | PRN

## 2017-06-02 ENCOUNTER — Ambulatory Visit (INDEPENDENT_AMBULATORY_CARE_PROVIDER_SITE_OTHER): Payer: 59 | Admitting: Psychology

## 2017-06-02 DIAGNOSIS — F4322 Adjustment disorder with anxiety: Secondary | ICD-10-CM | POA: Diagnosis not present

## 2017-06-08 NOTE — Progress Notes (Signed)
Subjective:    Patient ID: Scott Mitchell, male    DOB: [birth year], 27 y.o.   MRN: [id number]  HPI: 05/04/17 OV:  Scott Mitchell presents with constant HA that is localized over bil parietal/frontal head and rated 6/10, decribed as "burning/pressure". HA has been present > 3weeks and will worsen as the day progresses He denies CP/dyspnea/palpitations.  He has been experiencing daily dizziness with nausea without vomiting when driving or watching TV/playing video games. He denies recent falls or hx of head trauma (despite participating in kick boxing for years- he denies any injury/LOC). He stopped using THC daily 3 weeks ago, after >3 years of heavy daily use. He has also reduced caffeine intake from 5-6 cups coffee day to 1-2 cup/day. He has started swimming twice a week and estimates to drink 1/2- 1 gallon water/day He has been called by therapist and plans on making initial appt in week/or so. He denies thoughts of harming himself/others and feels that his anxiety/depression is stable. He continue to struggle making friends locally. He also reports cervical neck stiffness with spasm that started > 2 weeks ago He reports going to sleep at 1700 and waking at 0200 and going to work to complete 8 hr shift His company does not mind him shifting his work hours Reviewed recent labs at length  06/09/17 OV: Scott Mitchell presents for HAs, however they have dramatically reduced in frequency/severity over since 05/24/17.  He has only had one minor HA since then and it only last and self resolved.  HAs initially began 6/7 weeks ago after be abruptly stopping using marijuana (daily smoker for multiple years). He also stopped eating fast food daily and resumed regular exercise 3 weeks ago- wt lifting /cardio 90 min 3/4 days/week. When HAs do occur he reports sound sensitivity, but denies photosensitivity or change in vision. He denies hx of migraine HA He denies tobacco/ETOH/illicit drug  use  05/09/17 MRI Head WO Contrast IMPRESSION: Normal examination. No vascular abnormality to explain the presenting symptoms.   Patient Care Team    Relationship Specialty Notifications Start End  Julaine Fusi, NP PCP - General Family Medicine  03/19/17     Patient Active Problem List   Diagnosis Date Noted  . Cervical pain (neck) 05/04/2017  . Acute intractable headache 05/04/2017  . Healthcare maintenance 04/07/2017  . Anxiety and depression 04/07/2017     Past Medical History:  Diagnosis Date  . Depression   . Hemorrhoids      Past Surgical History:  Procedure Laterality Date  . ADENOIDECTOMY    . EYE SURGERY       Family History  Problem Relation Age of Onset  . Hypothyroidism Mother   . Hypertension Father   . Healthy Sister      Social History   Substance and Sexual Activity  Drug Use Yes  . Types: Marijuana   Comment: Per pt DC'd approximately 04/13/17     Social History   Substance and Sexual Activity  Alcohol Use No  . Frequency: Never     Social History   Tobacco Use  Smoking Status Never Smoker  Smokeless Tobacco Never Used     Outpatient Encounter Medications as of 06/09/2017  Medication Sig  . ibuprofen (ADVIL,MOTRIN) 800 MG tablet Take 1 tablet (800 mg total) by mouth 3 (three) times daily.  . cyclobenzaprine (FLEXERIL) 10 MG tablet Take 1 tablet (10 mg total) by mouth at bedtime.  . [DISCONTINUED] cyclobenzaprine (FLEXERIL) 10 MG tablet Take  1 tablet (10 mg total) by mouth at bedtime. (Patient not taking: Reported on 06/09/2017)  . [DISCONTINUED] ondansetron (ZOFRAN-ODT) 8 MG disintegrating tablet Take 1 tablet (8 mg total) by mouth every 8 (eight) hours as needed for nausea or vomiting.   No facility-administered encounter medications on file as of 06/09/2017.     Allergies: Patient has no known allergies.  Body mass index is 34.39 kg/m.  Blood pressure 117/76, pulse 64, height 5' 11.5" (1.816 m), weight 250 lb (113.4  kg), SpO2 100 %.    Review of Systems  Constitutional: Positive for fatigue. Negative for activity change, appetite change, chills, diaphoresis, fever and unexpected weight change.  HENT: Negative for congestion.   Eyes: Negative for visual disturbance.  Respiratory: Negative for cough, chest tightness, shortness of breath, wheezing and stridor.   Cardiovascular: Negative for chest pain, palpitations and leg swelling.  Gastrointestinal: Negative for abdominal distention, abdominal pain, blood in stool, constipation, diarrhea, nausea and vomiting.  Endocrine: Negative for cold intolerance, heat intolerance, polydipsia, polyphagia and polyuria.  Genitourinary: Negative for difficulty urinating and flank pain.  Musculoskeletal: Positive for neck stiffness. Negative for arthralgias, back pain, gait problem, joint swelling, myalgias and neck pain.  Skin: Negative for color change, pallor and rash.  Neurological: Positive for dizziness and headaches. Negative for tremors, seizures, speech difficulty and weakness.  Hematological: Does not bruise/bleed easily.  Psychiatric/Behavioral: Positive for dysphoric mood. Negative for confusion, decreased concentration, hallucinations, self-injury, sleep disturbance and suicidal ideas. The patient is nervous/anxious. The patient is not hyperactive.        Objective:   Physical Exam  Constitutional: He is oriented to person, place, and time. He appears well-developed and well-nourished. No distress.  HENT:  Head: Normocephalic and atraumatic.  Right Ear: Hearing, tympanic membrane, external ear and ear canal normal. Tympanic membrane is not perforated and not bulging. No decreased hearing is noted.  Left Ear: Hearing, tympanic membrane, external ear and ear canal normal. Tympanic membrane is not perforated and not bulging. No decreased hearing is noted.  Nose: Nose normal. Right sinus exhibits no maxillary sinus tenderness and no frontal sinus tenderness.  Left sinus exhibits no maxillary sinus tenderness and no frontal sinus tenderness.  Mouth/Throat: Uvula is midline, oropharynx is clear and moist and mucous membranes are normal.  Eyes: Pupils are equal, round, and reactive to light. Conjunctivae are normal.  Neck: Normal range of motion. Neck supple.  Cardiovascular: Normal rate, regular rhythm, normal heart sounds and intact distal pulses.  No murmur heard. Pulmonary/Chest: Effort normal and breath sounds normal. No respiratory distress. He has no wheezes. He has no rales. He exhibits no tenderness.  Musculoskeletal:       Left shoulder: Normal.       Cervical back: He exhibits tenderness and spasm.       Thoracic back: Normal.  Lymphadenopathy:    He has no cervical adenopathy.  Neurological: He is oriented to person, place, and time. He displays normal reflexes. No cranial nerve deficit. He exhibits normal muscle tone. Coordination normal.  Skin: Skin is warm and dry. No rash noted. He is not diaphoretic. No erythema. No pallor.  Psychiatric: He has a normal mood and affect. His behavior is normal. Judgment and thought content normal.  Nursing note and vitals reviewed.         Assessment & Plan:   1. Acute intractable headache, unspecified headache type   2. Cervical pain (neck)     Acute intractable headache Continue your excellent water  intake, healthy eating, and regular exercise. If headache develops, recommend OTC Ibuprofen per manufacturer's instructions. 05/09/17 MRI Head WO Contrast was normal. If headaches increase in severity/frequency again, please contact clinic.  Cervical pain (neck) Increase daily stretching and refill on cyclobenzaprine provided.    FOLLOW-UP:  Return if symptoms worsen or fail to improve.

## 2017-06-09 ENCOUNTER — Encounter: Payer: Self-pay | Admitting: Adult Health

## 2017-06-09 ENCOUNTER — Ambulatory Visit (INDEPENDENT_AMBULATORY_CARE_PROVIDER_SITE_OTHER): Payer: 59 | Admitting: Adult Health

## 2017-06-09 VITALS — BP 117/76 | HR 64 | Ht 71.5 in | Wt 250.0 lb

## 2017-06-09 DIAGNOSIS — R51 Headache: Secondary | ICD-10-CM

## 2017-06-09 DIAGNOSIS — M542 Cervicalgia: Secondary | ICD-10-CM | POA: Diagnosis not present

## 2017-06-09 DIAGNOSIS — R519 Headache, unspecified: Secondary | ICD-10-CM

## 2017-06-09 MED ORDER — CYCLOBENZAPRINE HCL 10 MG PO TABS: 10.0000 mg | ORAL_TABLET | Freq: Every day | ORAL | 1 refills | Status: DC

## 2017-06-09 NOTE — Patient Instructions (Addendum)
General Headache Without Cause A headache is pain or discomfort felt around the head or neck area. There are many causes and types of headaches. In some cases, the cause may not be found. Follow these instructions at home: Managing pain  Take over-the-counter and prescription medicines only as told by your doctor.  Lie down in a dark, quiet room when you have a headache.  If directed, apply ice to the head and neck area: ? Put ice in a plastic bag. ? Place a towel between your skin and the bag. ? Leave the ice on for 20 minutes, 2-3 times per day.  Use a heating pad or hot shower to apply heat to the head and neck area as told by your doctor.  Keep lights dim if bright lights bother you or make your headaches worse. Eating and drinking  Eat meals on a regular schedule.  Lessen how much alcohol you drink.  Lessen how much caffeine you drink, or stop drinking caffeine. General instructions  Keep all follow-up visits as told by your doctor. This is important.  Keep a journal to find out if certain things bring on headaches. For example, write down: ? What you eat and drink. ? How much sleep you get. ? Any change to your diet or medicines.  Relax by getting a massage or doing other relaxing activities.  Lessen stress.  Sit up straight. Do not tighten (tense) your muscles.  Do not use tobacco products. This includes cigarettes, chewing tobacco, or e-cigarettes. If you need help quitting, ask your doctor.  Exercise regularly as told by your doctor.  Get enough sleep. This often means 7-9 hours of sleep. Contact a doctor if:  Your symptoms are not helped by medicine.  You have a headache that feels different than the other headaches.  You feel sick to your stomach (nauseous) or you throw up (vomit).  You have a fever. Get help right away if:  Your headache becomes really bad.  You keep throwing up.  You have a stiff neck.  You have trouble seeing.  You have  trouble speaking.  You have pain in the eye or ear.  Your muscles are weak or you lose muscle control.  You lose your balance or have trouble walking.  You feel like you will pass out (faint) or you pass out.  You have confusion. This information is not intended to replace advice given to you by your health care provider. Make sure you discuss any questions you have with your health care provider. Document Released: 12/11/2007 Document Revised: 08/09/2015 Document Reviewed: 06/26/2014 Elsevier Interactive Patient Education  2018 [employer]  Continue your excellent water intake, healthy eating, and regular exercise. If headache develops, recommend OTC Ibuprofen per manufacturer's instructions. 05/09/17 MRI Head WO Contrast was normal. If headaches increase in severity/frequency again, please contact clinic. NICE TO SEE YOU!

## 2017-06-09 NOTE — Assessment & Plan Note (Signed)
Increase daily stretching and refill on cyclobenzaprine provided.

## 2017-06-09 NOTE — Assessment & Plan Note (Signed)
Continue your excellent water intake, healthy eating, and regular exercise. If headache develops, recommend OTC Ibuprofen per manufacturer's instructions. 05/09/17 MRI Head WO Contrast was normal. If headaches increase in severity/frequency again, please contact clinic.

## 2017-07-06 NOTE — Progress Notes (Signed)
Subjective:    Patient ID: Scott Mitchell, male    DOB: 20-Oct-1990, 27 y.o.   MRN: [id number]  HPI: 05/04/17 OV:  Scott Mitchell presents with constant HA that is localized over bil parietal/frontal head and rated 6/10, decribed as "burning/pressure". HA has been present > 3weeks and will worsen as the day progresses He denies CP/dyspnea/palpitations.  He has been experiencing daily dizziness with nausea without vomiting when driving or watching TV/playing video games. He denies recent falls or hx of head trauma (despite participating in kick boxing for years- he denies any injury/LOC). He stopped using THC daily 3 weeks ago, after >3 years of heavy daily use. He has also reduced caffeine intake from 5-6 cups coffee day to 1-2 cup/day. He has started swimming twice a week and estimates to drink 1/2- 1 gallon water/day He has been called by therapist and plans on making initial appt in week/or so. He denies thoughts of harming himself/others and feels that his anxiety/depression is stable. He continue to struggle making friends locally. He also reports cervical neck stiffness with spasm that started > 2 weeks ago He reports going to sleep at 1700 and waking at 0200 and going to work to complete 8 hr shift His company does not mind him shifting his work hours Reviewed recent labs at length  06/09/17 OV: Scott Mitchell presents for HAs, however they have dramatically reduced in frequency/severity over since 05/24/17.  He has only had one minor HA since then and it only last and self resolved.  HAs initially began 6/7 weeks ago after be abruptly stopping using marijuana (daily smoker for multiple years). He also stopped eating fast food daily and resumed regular exercise 3 weeks ago- wt lifting /cardio 90 min 3/4 days/week. When HAs do occur he reports sound sensitivity, but denies photosensitivity or change in vision. He denies hx of migraine HA He denies tobacco/ETOH/illicit drug  use  05/09/17 MRI Head WO Contrast IMPRESSION: Normal examination. No vascular abnormality to explain the presenting symptoms.  07/07/17 OV: Scott Mitchell presents for CPE He has increased exercise- 4 days/week of wt training and 3 days/week of cardio He has eliminated fast food and remains off tobacco products. He estimates to drink >60 oz water/day. He denies HA > 1 month  Healthcare Maintenance: Tdap updated today  Patient Care Team    Relationship Specialty Notifications Start End  Julaine Fusi, NP PCP - General Family Medicine  03/19/17     Patient Active Problem List   Diagnosis Date Noted  . Cervical pain (neck) 05/04/2017  . Healthcare maintenance 04/07/2017  . Anxiety and depression 04/07/2017     Past Medical History:  Diagnosis Date  . Depression   . Hemorrhoids      Past Surgical History:  Procedure Laterality Date  . ADENOIDECTOMY    . EYE SURGERY       Family History  Problem Relation Age of Onset  . Hypothyroidism Mother   . Hypertension Father   . Healthy Sister      Social History   Substance and Sexual Activity  Drug Use Yes  . Types: Marijuana   Comment: Per pt DC'd approximately 04/13/17     Social History   Substance and Sexual Activity  Alcohol Use No  . Frequency: Never     Social History   Tobacco Use  Smoking Status Never Smoker  Smokeless Tobacco Never Used     Outpatient Encounter Medications as of 07/07/2017  Medication Sig  .  cyclobenzaprine (FLEXERIL) 10 MG tablet Take 1 tablet (10 mg total) by mouth at bedtime. (Patient taking differently: Take 10 mg by mouth at bedtime as needed. )  . ibuprofen (ADVIL,MOTRIN) 800 MG tablet Take 1 tablet (800 mg total) by mouth 3 (three) times daily.   No facility-administered encounter medications on file as of 07/07/2017.     Allergies: Patient has no known allergies.  Body mass index is 33.42 kg/m.  Blood pressure 130/73, pulse 84, height 5' 11.5" (1.816 m), weight  243 lb (110.2 kg), SpO2 99 %.    Review of Systems  Constitutional: Positive for fatigue. Negative for activity change, appetite change, chills, diaphoresis, fever and unexpected weight change.  HENT: Negative for congestion.   Eyes: Negative for visual disturbance.  Respiratory: Negative for cough, chest tightness, shortness of breath, wheezing and stridor.   Cardiovascular: Negative for chest pain, palpitations and leg swelling.  Gastrointestinal: Negative for abdominal distention, abdominal pain, blood in stool, constipation, diarrhea, nausea and vomiting.  Endocrine: Negative for cold intolerance, heat intolerance, polydipsia, polyphagia and polyuria.  Genitourinary: Negative for difficulty urinating and flank pain.  Musculoskeletal: Negative for arthralgias, back pain, gait problem, joint swelling, myalgias, neck pain and neck stiffness.  Skin: Negative for color change, pallor and rash.  Neurological: Negative for dizziness, tremors, seizures, speech difficulty, weakness and headaches.  Hematological: Does not bruise/bleed easily.  Psychiatric/Behavioral: Negative for confusion, decreased concentration, dysphoric mood, hallucinations, self-injury, sleep disturbance and suicidal ideas. The patient is not nervous/anxious and is not hyperactive.        Objective:   Physical Exam  Constitutional: He is oriented to person, place, and time. He appears well-developed and well-nourished. No distress.  HENT:  Head: Normocephalic and atraumatic.  Right Ear: Hearing, tympanic membrane, external ear and ear canal normal. Tympanic membrane is not perforated and not bulging. No decreased hearing is noted.  Left Ear: Hearing, tympanic membrane, external ear and ear canal normal. Tympanic membrane is not perforated and not bulging. No decreased hearing is noted.  Nose: Nose normal. Right sinus exhibits no maxillary sinus tenderness and no frontal sinus tenderness. Left sinus exhibits no maxillary  sinus tenderness and no frontal sinus tenderness.  Mouth/Throat: Uvula is midline, oropharynx is clear and moist and mucous membranes are normal.  Eyes: Pupils are equal, round, and reactive to light. Conjunctivae are normal.  Neck: Normal range of motion. Neck supple.  Cardiovascular: Normal rate, regular rhythm, normal heart sounds and intact distal pulses.  No murmur heard. Pulmonary/Chest: Effort normal and breath sounds normal. No respiratory distress. He has no wheezes. He has no rales. He exhibits no tenderness.  Abdominal: Soft. Bowel sounds are normal. He exhibits no distension and no mass. There is no tenderness. There is no rebound and no guarding.  Musculoskeletal: He exhibits no edema or tenderness.       Left shoulder: Normal.       Cervical back: He exhibits spasm.       Thoracic back: Normal.  Lymphadenopathy:    He has no cervical adenopathy.  Neurological: He is oriented to person, place, and time. He displays normal reflexes. No cranial nerve deficit. He exhibits normal muscle tone. Coordination normal.  Skin: Skin is warm and dry. No rash noted. He is not diaphoretic. No erythema. No pallor.  Psychiatric: He has a normal mood and affect. His behavior is normal. Judgment and thought content normal.  Nursing note and vitals reviewed.     Assessment & Plan:   1.  Need for Tdap vaccination   2. Healthcare maintenance   3. Anxiety and depression     Healthcare maintenance Continue regular exercise, healthy eating, and excellent water intake. Recommend annual physical with fasting labs.  Anxiety and depression Denies sx's at present Increased regular exercise and improved diet has dramatically improved mood. He remains off Marijuana     FOLLOW-UP:  Return in about 1 year (around 07/08/2018) for Fasting Labs, CPE.

## 2017-07-07 ENCOUNTER — Ambulatory Visit (INDEPENDENT_AMBULATORY_CARE_PROVIDER_SITE_OTHER): Payer: 59 | Admitting: Psychology

## 2017-07-07 ENCOUNTER — Encounter: Payer: Self-pay | Admitting: Adult Health

## 2017-07-07 ENCOUNTER — Ambulatory Visit (INDEPENDENT_AMBULATORY_CARE_PROVIDER_SITE_OTHER): Payer: 59 | Admitting: Adult Health

## 2017-07-07 VITALS — BP 130/73 | HR 84 | Ht 71.5 in | Wt 243.0 lb

## 2017-07-07 DIAGNOSIS — Z23 Encounter for immunization: Secondary | ICD-10-CM | POA: Diagnosis not present

## 2017-07-07 DIAGNOSIS — Z0000 Encounter for general adult medical examination without abnormal findings: Secondary | ICD-10-CM | POA: Diagnosis not present

## 2017-07-07 DIAGNOSIS — F4322 Adjustment disorder with anxiety: Secondary | ICD-10-CM | POA: Diagnosis not present

## 2017-07-07 DIAGNOSIS — F329 Major depressive disorder, single episode, unspecified: Secondary | ICD-10-CM | POA: Diagnosis not present

## 2017-07-07 DIAGNOSIS — F419 Anxiety disorder, unspecified: Secondary | ICD-10-CM | POA: Diagnosis not present

## 2017-07-07 DIAGNOSIS — F32A Depression, unspecified: Secondary | ICD-10-CM

## 2017-07-07 NOTE — Assessment & Plan Note (Signed)
Denies sx's at present Increased regular exercise and improved diet has dramatically improved mood. He remains off Marijuana

## 2017-07-07 NOTE — Patient Instructions (Signed)

## 2017-07-07 NOTE — Assessment & Plan Note (Signed)
Continue regular exercise, healthy eating, and excellent water intake. Recommend annual physical with fasting labs.

## 2017-07-22 ENCOUNTER — Ambulatory Visit (INDEPENDENT_AMBULATORY_CARE_PROVIDER_SITE_OTHER): Payer: 59 | Admitting: Psychology

## 2017-07-22 DIAGNOSIS — F4322 Adjustment disorder with anxiety: Secondary | ICD-10-CM | POA: Diagnosis not present

## 2017-09-03 ENCOUNTER — Ambulatory Visit: Payer: 59 | Admitting: Psychology

## 2017-09-18 ENCOUNTER — Ambulatory Visit: Payer: 59 | Admitting: Psychology

## 2017-10-08 ENCOUNTER — Ambulatory Visit: Payer: 59 | Admitting: Psychology

## 2017-11-20 ENCOUNTER — Encounter: Payer: Self-pay | Admitting: Adult Health

## 2017-12-23 ENCOUNTER — Ambulatory Visit: Payer: Self-pay | Admitting: Adult Health

## 2018-04-12 NOTE — Progress Notes (Signed)
Subjective:    Patient ID: Scott Mitchell, male    DOB: 1991/03/06, 28 y.o.   MRN: [id number]  HPI: 05/04/17 OV:  Scott Mitchell presents with constant HA that is localized over bil parietal/frontal head and rated 6/10, decribed as "burning/pressure". HA has been present > 3weeks and will worsen as the day progresses He denies CP/dyspnea/palpitations.  He has been experiencing daily dizziness with nausea without vomiting when driving or watching TV/playing video games. He denies recent falls or hx of head trauma (despite participating in kick boxing for years- he denies any injury/LOC). He stopped using THC daily 3 weeks ago, after >3 years of heavy daily use. He has also reduced caffeine intake from 5-6 cups coffee day to 1-2 cup/day. He has started swimming twice a week and estimates to drink 1/2- 1 gallon water/day He has been called by therapist and plans on making initial appt in week/or so. He denies thoughts of harming himself/others and feels that his anxiety/depression is stable. He continue to struggle making friends locally. He also reports cervical neck stiffness with spasm that started > 2 weeks ago He reports going to sleep at 1700 and waking at 0200 and going to work to complete 8 hr shift His company does not mind him shifting his work hours Reviewed recent labs at length  06/09/17 OV: Scott Mitchell presents for HAs, however they have dramatically reduced in frequency/severity over since 05/24/17.  He has only had one minor HA since then and it only last and self resolved.  HAs initially began 6/7 weeks ago after be abruptly stopping using marijuana (daily smoker for multiple years). He also stopped eating fast food daily and resumed regular exercise 3 weeks ago- wt lifting /cardio 90 min 3/4 days/week. When HAs do occur he reports sound sensitivity, but denies photosensitivity or change in vision. He denies hx of migraine HA He denies tobacco/ETOH/illicit drug  use  05/09/17 MRI Head WO Contrast IMPRESSION: Normal examination. No vascular abnormality to explain the presenting symptoms.  07/07/17 OV: Scott Mitchell presents for CPE He has increased exercise- 4 days/week of wt training and 3 days/week of cardio He has eliminated fast food and remains off tobacco products. He estimates to drink >60 oz water/day. He denies HA > 1 month   04/13/2018 OV: Scott Mitchell presents with weight gain, depression, lack energy, lack of interest,and poor focus that last 4-5 weeks. He denies thoughts of harming himself/others He has not used ETOH/marijuana in >6 months He reports excellent performance at work, then will come home and play video games for hours He has not exercised >3 months He has gained >20 lbs in >2 months He did not find therapy appt's especially helpful, would like evaluation by Psychiatrist instead. He stopped Sertraline months ago, is open to trying a different SSRI   Patient Care Team    Relationship Specialty Notifications Start End  Julaine Fusi, NP PCP - General Family Medicine  03/19/17     Patient Active Problem List   Diagnosis Date Noted  . Depression, recurrent (HCC) 04/13/2018  . Fatigue 04/13/2018  . Weight gain 04/13/2018  . Cervical pain (neck) 05/04/2017  . Healthcare maintenance 04/07/2017  . Anxiety and depression 04/07/2017     Past Medical History:  Diagnosis Date  . Depression   . Hemorrhoids      Past Surgical History:  Procedure Laterality Date  . ADENOIDECTOMY    . EYE SURGERY       Family History  Problem Relation Age of Onset  . Hypothyroidism Mother   . Hypertension Father   . Healthy Sister      Social History   Substance and Sexual Activity  Drug Use Yes  . Types: Marijuana   Comment: Per pt DC'd approximately 04/13/17     Social History   Substance and Sexual Activity  Alcohol Use No  . Frequency: Never     Social History   Tobacco Use  Smoking Status Never Smoker   Smokeless Tobacco Never Used     Outpatient Encounter Medications as of 04/13/2018  Medication Sig  . cyclobenzaprine (FLEXERIL) 10 MG tablet Take 1 tablet (10 mg total) by mouth at bedtime. (Patient taking differently: Take 10 mg by mouth at bedtime as needed. )  . ibuprofen (ADVIL,MOTRIN) 800 MG tablet Take 1 tablet (800 mg total) by mouth 3 (three) times daily.  Marland Kitchen. FLUoxetine (PROZAC) 20 MG tablet 1/2 tablet by mouth daily for one week, then increase to one full tablet daily   No facility-administered encounter medications on file as of 04/13/2018.     Allergies: Patient has no known allergies.  Body mass index is 35.91 kg/m.  Blood pressure 136/85, pulse 66, temperature 98.8 F (37.1 C), temperature source Oral, height 5' 11.5" (1.816 m), weight 261 lb 1.9 oz (118.4 kg), SpO2 96 %.    Review of Systems  Constitutional: Positive for fatigue. Negative for activity change, appetite change, chills, diaphoresis, fever and unexpected weight change.  HENT: Negative for congestion.   Eyes: Negative for visual disturbance.  Respiratory: Negative for cough, chest tightness, shortness of breath, wheezing and stridor.   Cardiovascular: Negative for chest pain, palpitations and leg swelling.  Gastrointestinal: Negative for abdominal distention, abdominal pain, blood in stool, constipation, diarrhea, nausea and vomiting.  Endocrine: Negative for cold intolerance, heat intolerance, polydipsia, polyphagia and polyuria.  Genitourinary: Negative for difficulty urinating and flank pain.  Musculoskeletal: Negative for arthralgias, back pain, gait problem, joint swelling, myalgias, neck pain and neck stiffness.  Skin: Negative for color change, pallor and rash.  Neurological: Negative for dizziness, tremors, seizures, speech difficulty, weakness and headaches.  Hematological: Does not bruise/bleed easily.  Psychiatric/Behavioral: Negative for confusion, decreased concentration, dysphoric mood,  hallucinations, self-injury, sleep disturbance and suicidal ideas. The patient is not nervous/anxious and is not hyperactive.        Objective:   Physical Exam  Constitutional: He is oriented to person, place, and time. He appears well-developed and well-nourished. No distress.  HENT:  Head: Normocephalic and atraumatic.  Right Ear: Hearing, tympanic membrane, external ear and ear canal normal. Tympanic membrane is not perforated and not bulging. No decreased hearing is noted.  Left Ear: Hearing, tympanic membrane, external ear and ear canal normal. Tympanic membrane is not perforated and not bulging. No decreased hearing is noted.  Nose: Nose normal. Right sinus exhibits no maxillary sinus tenderness and no frontal sinus tenderness. Left sinus exhibits no maxillary sinus tenderness and no frontal sinus tenderness.  Mouth/Throat: Uvula is midline, oropharynx is clear and moist and mucous membranes are normal.  Eyes: Pupils are equal, round, and reactive to light. Conjunctivae and EOM are normal.  Cardiovascular: Normal rate, regular rhythm, normal heart sounds and intact distal pulses.  No murmur heard. Pulmonary/Chest: Effort normal and breath sounds normal. No respiratory distress. He has no wheezes. He has no rales. He exhibits no tenderness.  Musculoskeletal:     Left shoulder: Normal.     Thoracic back: Normal.  Neurological: He is  oriented to person, place, and time. He exhibits normal muscle tone.  Skin: Skin is warm and dry. No rash noted. He is not diaphoretic. No erythema. No pallor.  Psychiatric: His speech is normal and behavior is normal. Judgment and thought content normal. Cognition and memory are normal. He exhibits a depressed mood.  Nursing note and vitals reviewed.     Assessment & Plan:   1. Depression, recurrent (HCC)   2. Fatigue, unspecified type   3. Weight gain     Depression, recurrent (HCC) Please start Fluoxetine 20mg - 1/2 tab daily for one week, then  increase to one tablet daily- hold at this dose. Please send MyChart message in 4 weeks and let me know how you are tolerating medication. Continue to drink plenty of fluids and increase regular exercise. Referral to psychiatry placed. We will call you when lab results are available. Follow-up in 3 months.  Fatigue CMP Vit D TSH Drawn  Weight gain CMP Vit D TSH Drawn    FOLLOW-UP:  Return in about 3 months (around 07/13/2018) for Regular Follow Up.

## 2018-04-13 ENCOUNTER — Ambulatory Visit (INDEPENDENT_AMBULATORY_CARE_PROVIDER_SITE_OTHER): Payer: 59 | Admitting: Adult Health

## 2018-04-13 ENCOUNTER — Encounter: Payer: Self-pay | Admitting: Adult Health

## 2018-04-13 VITALS — BP 136/85 | HR 66 | Temp 98.8°F | Ht 71.5 in | Wt 261.1 lb

## 2018-04-13 DIAGNOSIS — R5383 Other fatigue: Secondary | ICD-10-CM | POA: Insufficient documentation

## 2018-04-13 DIAGNOSIS — R635 Abnormal weight gain: Secondary | ICD-10-CM | POA: Diagnosis not present

## 2018-04-13 DIAGNOSIS — F339 Major depressive disorder, recurrent, unspecified: Secondary | ICD-10-CM | POA: Diagnosis not present

## 2018-04-13 MED ORDER — FLUOXETINE HCL 20 MG PO TABS: ORAL_TABLET | ORAL | 3 refills | Status: DC

## 2018-04-13 NOTE — Assessment & Plan Note (Signed)
CMP Vit D TSH Drawn

## 2018-04-13 NOTE — Assessment & Plan Note (Signed)
Please start Fluoxetine 20mg - 1/2 tab daily for one week, then increase to one tablet daily- hold at this dose. Please send MyChart message in 4 weeks and let me know how you are tolerating medication. Continue to drink plenty of fluids and increase regular exercise. Referral to psychiatry placed. We will call you when lab results are available. Follow-up in 3 months.

## 2018-04-13 NOTE — Patient Instructions (Signed)
Persistent Depressive Disorder  Persistent depressive disorder (PDD) is a mental health condition. PDD causes symptoms of low-level depression for 2 years or longer. It may also be called long-term (chronic) depression or dysthymia. PDD may include episodes of more severe depression that last for about 2 weeks (major depressive disorder or MDD). PDD can affect the way you think, feel, and sleep. This condition may also affect your relationships. You may be more likely to get sick if you have PDD. Symptoms of PDD occur for most of the day and may include:  Feeling tired (fatigue).  Low energy.  Eating too much or too little.  Sleeping too much or too little.  Feeling restless or agitated.  Feeling hopeless.  Feeling worthless or guilty.  Feeling worried or nervous (anxiety).  Trouble concentrating or making decisions.  Low self-esteem.  A negative way of looking at things (outlook).  Not being able to have fun or feel pleasure.  Avoiding interacting with people.  Getting angry or annoyed easily (irritability).  Acting aggressive or angry. Follow these instructions at home: Activity  Go back to your normal activities as told by your doctor.  Exercise regularly as told by your doctor. General instructions  Take over-the-counter and prescription medicines only as told by your doctor.  Do not drink alcohol. Or, limit how much alcohol you drink to no more than 1 drink a day for nonpregnant women and 2 drinks a day for men. One drink equals 12 oz of beer, 5 oz of wine, or 1 oz of hard liquor. Alcohol can affect any antidepressant medicines you are taking. Talk with your doctor about your alcohol use.  Eat a healthy diet and get plenty of sleep.  Find activities that you enjoy each day.  Consider joining a support group. Your doctor may be able to suggest a support group.  Keep all follow-up visits as told by your doctor. This is important. Where to find more  information [employer]  www.nami.org [employer]  http://www.maynard.net/www.nimh.nih.gov National Suicide Prevention Lifeline  (701)292-4479(1-707-678-0761).  This is free, 24-hour help. Contact a doctor if:  Your symptoms get worse.  You have new symptoms.  You have trouble sleeping or doing your daily activities. Get help right away if:  You self-harm.  You have serious thoughts about hurting yourself or others.  You see, hear, taste, smell, or feel things that are not there (hallucinate). This information is not intended to replace advice given to you by your health care provider. Make sure you discuss any questions you have with your health care provider. Document Released: 02/12/2015 Document Revised: 10/26/2015 Document Reviewed: 10/26/2015 Elsevier Interactive Patient Education  2019 [employer]  Please start Fluoxetine 20mg - 1/2 tab daily for one week, then increase to one tablet daily- hold at this dose. Please send MyChart message in 4 weeks and let me know how you are tolerating medication. Continue to drink plenty of fluids and increase regular exercise. Referral to psychiatry placed. We will call you when lab results are available. Follow-up in 3 months. FEEL BETTER!

## 2018-04-13 NOTE — Assessment & Plan Note (Signed)
CMP Vit D TSH Drawn 

## 2018-04-14 ENCOUNTER — Other Ambulatory Visit: Payer: Self-pay | Admitting: Adult Health

## 2018-04-14 DIAGNOSIS — E559 Vitamin D deficiency, unspecified: Secondary | ICD-10-CM

## 2018-04-14 LAB — COMPREHENSIVE METABOLIC PANEL
ALBUMIN: 5 g/dL (ref 4.1–5.2)
ALT: 34 IU/L (ref 0–44)
AST: 32 IU/L (ref 0–40)
Albumin/Globulin Ratio: 2.2 (ref 1.2–2.2)
Alkaline Phosphatase: 37 IU/L — ABNORMAL LOW (ref 39–117)
BILIRUBIN TOTAL: 0.8 mg/dL (ref 0.0–1.2)
BUN / CREAT RATIO: 11 (ref 9–20)
BUN: 14 mg/dL (ref 6–20)
CALCIUM: 9.5 mg/dL (ref 8.7–10.2)
CHLORIDE: 99 mmol/L (ref 96–106)
CO2: 23 mmol/L (ref 20–29)
CREATININE: 1.29 mg/dL — AB (ref 0.76–1.27)
GFR, EST AFRICAN AMERICAN: 86 mL/min/{1.73_m2} (ref >59)
GFR, EST NON AFRICAN AMERICAN: 74 mL/min/{1.73_m2} (ref >59)
GLUCOSE: 83 mg/dL (ref 65–99)
Globulin, Total: 2.3 g/dL (ref 1.5–4.5)
Potassium: 4.3 mmol/L (ref 3.5–5.2)
Sodium: 139 mmol/L (ref 134–144)
TOTAL PROTEIN: 7.3 g/dL (ref 6.0–8.5)

## 2018-04-14 LAB — TSH: TSH: 2.16 u[IU]/mL (ref 0.450–4.500)

## 2018-04-14 LAB — VITAMIN D 25 HYDROXY (VIT D DEFICIENCY, FRACTURES): Vit D, 25-Hydroxy: 15.9 ng/mL — ABNORMAL LOW (ref 30.0–100.0)

## 2018-04-14 MED ORDER — VITAMIN D (ERGOCALCIFEROL) 1.25 MG (50000 UNIT) PO CAPS: 50000.0000 [IU] | ORAL_CAPSULE | ORAL | 0 refills | Status: DC

## 2018-04-22 ENCOUNTER — Encounter (HOSPITAL_COMMUNITY): Payer: Self-pay | Admitting: Psychiatry

## 2018-04-22 ENCOUNTER — Ambulatory Visit (INDEPENDENT_AMBULATORY_CARE_PROVIDER_SITE_OTHER): Payer: 59 | Admitting: Psychiatry

## 2018-04-22 VITALS — BP 103/68 | HR 74 | Ht 72.0 in | Wt 262.0 lb

## 2018-04-22 DIAGNOSIS — F331 Major depressive disorder, recurrent, moderate: Secondary | ICD-10-CM | POA: Diagnosis not present

## 2018-04-22 MED ORDER — BUPROPION HCL ER (SR) 150 MG PO TB12: 150.0000 mg | ORAL_TABLET | Freq: Two times a day (BID) | ORAL | 0 refills | Status: DC

## 2018-04-22 NOTE — Progress Notes (Signed)
Psychiatric Initial Adult Assessment   Patient Identification: Scott Mitchell MRN:  [id number] Date of Evaluation:  04/22/2018 Referral Source: [place] BingKathy Danford NP Chief Complaint:  Depression, apathy, low energy Visit Diagnosis:    ICD-10-CM   1. Moderate episode of recurrent major depressive disorder (HCC) F33.1     History of Present Illness:  28 yo single male who presets with approximately 4 week history of feeling depressed, apathetic, has low energy, no motivation, some anxiety, problems with concentration and short term memory. He denies having changes in appetite, sleep is good, he denies feeling hopeless or suicidal. He is forgetful - describes situation that he would drop a glass he is carrying because he gets distracted and forgets he is holding one? He went to see his PCP and was started a week ago on 10 mg of fluoxetine with a plan to increase it 20 mg. Scott Mitchell reports one previous depressive episode in 2017 when he was taking 50 mg of sertraline but stopped it after one month as he "did not want to rely on medication to feel better". He does not abuse alcohol or drugs - in the past was smoking cannabis. He has no hx of mania or psychosis. He has never been psychiatrically hospitalized. There is no family psychiatric or substance abuse history. He does not identify any recent stressors which could have precipitated change in his mood. 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 He lives alone, has a dog, denies having any close friends.  Associated Signs/Symptoms: Depression Symptoms:  depressed mood, fatigue, difficulty concentrating, impaired memory, anxiety, (Hypo) Manic Symptoms:  none Anxiety Symptoms:  Excessive Worry, Psychotic Symptoms:  none PTSD Symptoms: Negative  Past Psychiatric History: see above  Previous Psychotropic Medications: Yes   Substance Abuse  History in the last 12 months:  No.  Consequences of Substance Abuse: Negative  Past Medical History:  Past Medical History:  Diagnosis Date  . Anxiety   . Depression   . Hemorrhoids     Past Surgical History:  Procedure Laterality Date  . ADENOIDECTOMY    . EYE SURGERY      Family Psychiatric History: none identified  Family History:  Family History  Problem Relation Age of Onset  . Hypothyroidism Mother   . Hypertension Father   . Healthy Sister     Social History:   Social History   Socioeconomic History  . Marital status: Single    Spouse name: Not on file  . Number of children: 0  . Years of education: Not on file  . Highest education level: Bachelor's degree (e.g., BA, AB, BS)  Occupational History  . Not on file  Social Needs  . Financial resource strain: Not hard at all  . Food insecurity:    Worry: Never true    Inability: Never true  . Transportation needs:    Medical: No    Non-medical: No  Tobacco Use  . Smoking status: Former Smoker    Types: Cigarettes  . Smokeless tobacco: Never Used  Substance and Sexual Activity  . Alcohol use: No    Frequency: Never  . Drug use: Yes    Types: Marijuana    Comment: Per pt DC'd approximately 04/13/17  . Sexual activity: Never  Lifestyle  . Physical activity:    Days per week: 3 days    Minutes per session: 60 min  . Stress:  Rather much  Relationships  . Social connections:    Talks on phone: More than three times a week    Gets together: Once a week    Attends religious service: Never    Active member of club or organization: No    Attends meetings of clubs or organizations: Never    Relationship status: Never married  Other Topics Concern  . Not on file  Social History Narrative  . Not on file    Additional Social History: [place] is from [place] sister still lives. He likes to play video games in his free time, walks the dig but is not very physically active. No close friend  locally.  Allergies:  No Known Allergies  Metabolic Disorder Labs: Lab Results  Component Value Date   HGBA1C 5.2 04/07/2017   No results found for: PROLACTIN Lab Results  Component Value Date   CHOL 160 04/07/2017   TRIG 98 04/07/2017   HDL 39 (L) 04/07/2017   CHOLHDL 4.1 04/07/2017   LDLCALC 101 (H) 04/07/2017   Lab Results  Component Value Date   TSH 2.160 04/13/2018    Therapeutic Level Labs: No results found for: LITHIUM No results found for: CBMZ No results found for: VALPROATE  Current Medications: Current Outpatient Medications  Medication Sig Dispense Refill  . cyclobenzaprine (FLEXERIL) 10 MG tablet Take 1 tablet (10 mg total) by mouth at bedtime. (Patient taking differently: Take 10 mg by mouth at bedtime as needed. ) 30 tablet 1  . Vitamin D, Ergocalciferol, (DRISDOL) 1.25 MG (50000 UT) CAPS capsule Take 1 capsule (50,000 Units total) by mouth every 7 (seven) days. 16 capsule 0  . buPROPion (WELLBUTRIN SR) 150 MG 12 hr tablet Take 1 tablet (150 mg total) by mouth 2 (two) times daily for 30 days. 60 tablet 0  . ibuprofen (ADVIL,MOTRIN) 800 MG tablet Take 1 tablet (800 mg total) by mouth 3 (three) times daily. (Patient not taking: Reported on 04/22/2018) 21 tablet 0   No current facility-administered medications for this visit.     Musculoskeletal: Strength & Muscle Tone: within normal limits Gait & Station: normal Patient leans: N/A  Psychiatric Specialty Exam: Review of Systems  Constitutional: Negative.   HENT: Negative.   Eyes: Negative.   Respiratory: Negative.   Cardiovascular: Negative.   Gastrointestinal: Negative.   Genitourinary: Negative.   Musculoskeletal: Negative.   Skin: Negative.   Neurological: Negative.   Endo/Heme/Allergies: Negative.   Psychiatric/Behavioral: Positive for depression.    Blood pressure 103/68, pulse 74, height 6' (1.829 m), weight 262 lb (118.8 kg), SpO2 97 %.Body mass index is 35.53 kg/m.  General Appearance:  Casual and Well Groomed  Eye Contact:  Good  Speech:  Clear and Coherent  Volume:  Normal  Mood:  Depressed  Affect:  Congruent and Constricted  Thought Process:  Coherent and Goal Directed  Orientation:  Full (Time, Place, and Person)  Thought Content:  Logical  Suicidal Thoughts:  No  Homicidal Thoughts:  No  Memory:  Immediate;   Good Recent;   Good Remote;   Good  Judgement:  Fair  Insight:  Fair  Psychomotor Activity:  Normal  Concentration:  Concentration: Fair and Attention Span: Fair  Recall:  Good  Fund of Knowledge:Good  Language: Good  Akathisia:  Negative  Handed:  Right  AIMS (if indicated):  not done  Assets:  Engineer, maintenanceCommunication Skills Housing Physical Health Vocational/Educational  ADL's:  Intact  Cognition: WNL  Sleep:  Good  Screenings: PHQ2-9     Office Visit from 04/13/2018 in [facility] at [facility] Visit from 07/07/2017 in [facility] Care at [facility] Visit from 06/09/2017 in [facility] at [facility] Visit from 05/04/2017 in [facility] Care at [facility] Visit from 04/07/2017 in [facility] Care at [facility]  PHQ-2 Total Score  4  1  1  3  5   PHQ-9 Total Score  10  3  7  10  14       Assessment and Plan: 28 yo single male who presets with approximately 4 week history of feeling depressed, apathetic, has low energy, no motivation, some anxiety, problems with concentration and short term memory. He denies having changes in appetite, sleep is good, he denies feeling hopeless or suicidal. He is forgetful, not particularly satisfied with the job he has been performing for past 4 years. Otherwise no clear stressors identified. He has been started on 10 mg of fl;uoxetine by his PCP a week ago. One previous depressive episode - briefly on sertraline with doubtful benefit but did not stay on it long enough. No hx of SI, no mania and no psychosis.  Diagnostic impression: Major  depressive disorder recurrent moderate  Plan: Discontinue fluoxetine and instead start bupropion SR 150 mg in AM for one week then increase dose to 150 mg bid (4 AM when ge gets up and 1 PM second dose). The plan was discussed with patient. I spend 55 minutes in direct face to face clinical contact with the patient and devoted approximately 50% of this time to explanation of diagnosis, discussion of treatment options and med education. Patient will return to clinic in 4 weeks.   Magdalene Patricia, MD 2/6/20203:48 PM

## 2018-05-20 ENCOUNTER — Encounter (HOSPITAL_COMMUNITY): Payer: Self-pay | Admitting: Psychiatry

## 2018-05-20 ENCOUNTER — Ambulatory Visit (INDEPENDENT_AMBULATORY_CARE_PROVIDER_SITE_OTHER): Payer: 59 | Admitting: Psychiatry

## 2018-05-20 VITALS — BP 126/72 | HR 74 | Ht 72.0 in | Wt 269.0 lb

## 2018-05-20 DIAGNOSIS — F331 Major depressive disorder, recurrent, moderate: Secondary | ICD-10-CM

## 2018-05-20 MED ORDER — BUPROPION HCL ER (SR) 150 MG PO TB12: 150.0000 mg | ORAL_TABLET | Freq: Two times a day (BID) | ORAL | 0 refills | Status: DC

## 2018-05-20 NOTE — Progress Notes (Signed)
BH MD/PA/NP OP Progress Note  05/20/2018 1:56 PM Scott Mitchell  MRN:  [id number]  Chief Complaint: Depression HPI: 28 yo single male who presets with approximately 4 week history of feeling depressed, apathetic, has low energy, no motivation, some anxiety, problems with concentration and short term memory. He denies having changes in appetite, sleep is good, he denies feeling hopeless or suicidal. He is forgetful, not particularly satisfied with the job he has been performing for past 4 years. Otherwise no clear stressors identified. He has been started on 10 mg of fluoxetine by his PCP last month. One previous depressive episode - briefly on sertraline with doubtful benefit but did not stay on it long enough. No hx of SI, no mania and no psychosis. We have discontinued fluoxetine and started bupropion SR 150 mg bid to help with low energy (physical and mental), low motivation, depression. He appears to started to respond to it as the number of "good days" has been increasing.  Visit Diagnosis:    ICD-10-CM   1. Moderate episode of recurrent major depressive disorder (HCC) F33.1     Past Psychiatric History: Please see initial H&P.  Past Medical History:  Past Medical History:  Diagnosis Date  . Anxiety   . Depression   . Hemorrhoids     Past Surgical History:  Procedure Laterality Date  . ADENOIDECTOMY    . EYE SURGERY      Family Psychiatric History: reviewed  Family History:  Family History  Problem Relation Age of Onset  . Hypothyroidism Mother   . Hypertension Father   . Healthy Sister     Social History:  Social History   Socioeconomic History  . Marital status: Single    Spouse name: Not on file  . Number of children: 0  . Years of education: Not on file  . Highest education level: Bachelor's degree (e.g., BA, AB, BS)  Occupational History  . Not on file  Social Needs  . Financial resource strain: Not hard at all  . Food insecurity:    Worry: Never true     Inability: Never true  . Transportation needs:    Medical: No    Non-medical: No  Tobacco Use  . Smoking status: Former Smoker    Types: Cigarettes  . Smokeless tobacco: Never Used  Substance and Sexual Activity  . Alcohol use: No    Frequency: Never  . Drug use: Yes    Types: Marijuana    Comment: Per pt DC'd approximately 04/13/17  . Sexual activity: Never  Lifestyle  . Physical activity:    Days per week: 3 days    Minutes per session: 60 min  . Stress: Rather much  Relationships  . Social connections:    Talks on phone: More than three times a week    Gets together: Once a week    Attends religious service: Never    Active member of club or organization: No    Attends meetings of clubs or organizations: Never    Relationship status: Never married  Other Topics Concern  . Not on file  Social History Narrative  . Not on file    Allergies: No Known Allergies  Metabolic Disorder Labs: Lab Results  Component Value Date   HGBA1C 5.2 04/07/2017   No results found for: PROLACTIN Lab Results  Component Value Date   CHOL 160 04/07/2017   TRIG 98 04/07/2017   HDL 39 (L) 04/07/2017   CHOLHDL 4.1 04/07/2017   LDLCALC 101 (  H) 04/07/2017   Lab Results  Component Value Date   TSH 2.160 04/13/2018   TSH 2.250 04/07/2017    Therapeutic Level Labs: No results found for: LITHIUM No results found for: VALPROATE No components found for:  CBMZ  Current Medications: Current Outpatient Medications  Medication Sig Dispense Refill  . buPROPion (WELLBUTRIN SR) 150 MG 12 hr tablet Take 1 tablet (150 mg total) by mouth 2 (two) times daily for 30 days. 60 tablet 0  . cyclobenzaprine (FLEXERIL) 10 MG tablet Take 1 tablet (10 mg total) by mouth at bedtime. (Patient taking differently: Take 10 mg by mouth at bedtime as needed. ) 30 tablet 1  . ibuprofen (ADVIL,MOTRIN) 800 MG tablet Take 1 tablet (800 mg total) by mouth 3 (three) times daily. (Patient not taking: Reported on  04/22/2018) 21 tablet 0  . Vitamin D, Ergocalciferol, (DRISDOL) 1.25 MG (50000 UT) CAPS capsule Take 1 capsule (50,000 Units total) by mouth every 7 (seven) days. 16 capsule 0   No current facility-administered medications for this visit.      Musculoskeletal: Strength & Muscle Tone: within normal limits Gait & Station: normal Patient leans: N/A  Psychiatric Specialty Exam: Review of Systems  Constitutional: Negative.   HENT: Negative.   Eyes: Negative.   Respiratory: Negative.   Cardiovascular: Negative.   Gastrointestinal: Negative.   Genitourinary: Negative.   Musculoskeletal: Negative.   Skin: Negative.   Neurological: Negative.   Endo/Heme/Allergies: Negative.   Psychiatric/Behavioral: Positive for depression.    There were no vitals taken for this visit.There is no height or weight on file to calculate BMI.  General Appearance: Casual and Well Groomed  Eye Contact:  Good  Speech:  Clear and Coherent and Normal Rate  Volume:  Normal  Mood:  Anxious  Affect:  Full Range  Thought Process:  Goal Directed  Orientation:  Full (Time, Place, and Person)  Thought Content: Logical   Suicidal Thoughts:  No  Homicidal Thoughts:  No  Memory:  Immediate;   Good Recent;   Good Remote;   Good  Judgement:  Intact  Insight:  Fair  Psychomotor Activity:  Normal  Concentration:  Concentration: Good  Recall:  Good  Fund of Knowledge: Good  Language: Good  Akathisia:  Negative  Handed:  Right  AIMS (if indicated): not done  Assets:  Communication Skills Desire for Improvement Housing Physical Health Transportation Vocational/Educational  ADL's:  Intact  Cognition: WNL  Sleep:  Good   Screenings: PHQ2-9     Office Visit from 04/13/2018 in [facility] at [facility] Visit from 07/07/2017 in [facility] Care at [facility] Visit from 06/09/2017 in [facility] at [facility] Visit from 05/04/2017 in [facility] Primary Care  at [facility] Visit from 04/07/2017 in [facility] Primary Care at [facility]  PHQ-2 Total Score  PHQ-9 Total Score  Assessment and Plan: 28 yo single male who presets with approximately 8 week history of feeling depressed, apathetic, has low energy, no motivation, some anxiety, problems with concentration and short term memory. He denies having changes in appetite, sleep is good, he denies feeling hopeless or suicidal. He is not particularly satisfied with the job he has been performing for past 4 years. Otherwise no clear stressors identified. He has been started on 10 mg  of fluoxetine by his PCP last month. One previous depressive episode - briefly on sertraline with doubtful benefit. No hx of SI, no mania and no psychosis. We have discontinued fluoxetine and started bupropion SR 150 mg bid to help with low energy (physical and mental), low motivation, depression. He appears to started to respond to it as the number of "good days" has been increasing. Some mild anxiety. Scott Mitchell reports good sleep and some decrease in appetite which he appreciates and attributes to Wellbutrin.   Dx: MDD recurrent mild  Plan: Continue Wellbutrin SR 150 mg bid for now. Return to clinic in 2 months. Depending of progress he makes we may increase Wellbutrin to 200 mg bid and/or add Buspar for anxiety.    Magdalene Patricia, MD 05/20/2018, 1:56 PM

## 2018-06-12 ENCOUNTER — Other Ambulatory Visit (HOSPITAL_COMMUNITY): Payer: Self-pay | Admitting: Psychiatry

## 2018-07-07 ENCOUNTER — Other Ambulatory Visit: Payer: Self-pay | Admitting: Adult Health

## 2018-07-22 ENCOUNTER — Ambulatory Visit (HOSPITAL_COMMUNITY): Payer: 59 | Admitting: Psychiatry

## 2018-07-29 ENCOUNTER — Other Ambulatory Visit (HOSPITAL_COMMUNITY): Payer: Self-pay

## 2018-07-29 MED ORDER — BUPROPION HCL ER (SR) 150 MG PO TB12: 150.0000 mg | ORAL_TABLET | Freq: Two times a day (BID) | ORAL | 0 refills | Status: DC

## 2018-08-20 ENCOUNTER — Other Ambulatory Visit (HOSPITAL_COMMUNITY): Payer: Self-pay | Admitting: Psychiatry

## 2018-09-29 ENCOUNTER — Ambulatory Visit (HOSPITAL_COMMUNITY): Payer: 59 | Admitting: Psychiatry

## 2018-10-19 ENCOUNTER — Encounter: Payer: Self-pay | Admitting: Adult Health

## 2020-02-13 IMAGING — MR MR MRA HEAD W/O CM
1 series · 23 of 48 positions shown · non-contrast
Comparison: None.

CLINICAL DATA: Headaches and dizziness over the last 3-4 weeks.

EXAM:
MRA HEAD WITHOUT CONTRAST
TECHNIQUE: Angiographic images of the Circle of Willis were obtained using MRA
technique without intravenous contrast.

[Series 3: tof_3d_multi-slab new · axial · 0.7mm · 0.39mm/px · z∈[-24,+77]mm · 23 of 156 slices shown]
[im 1/156]
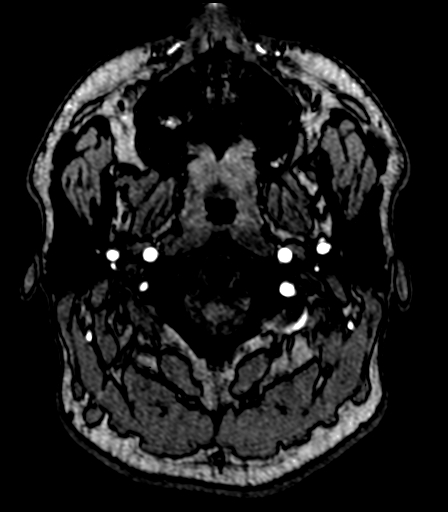
[im 4/156]
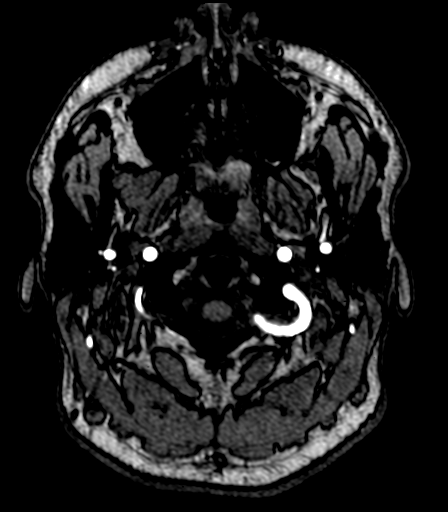
[im 7/156]
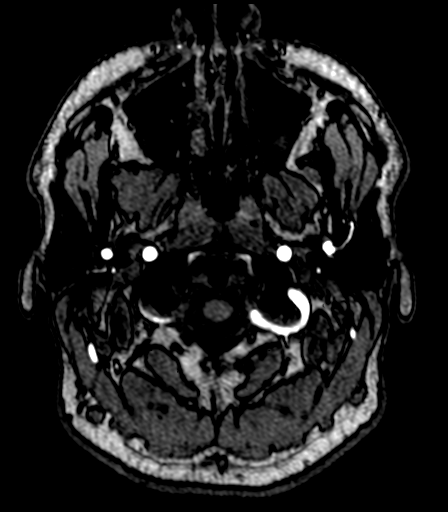
[im 10/156]
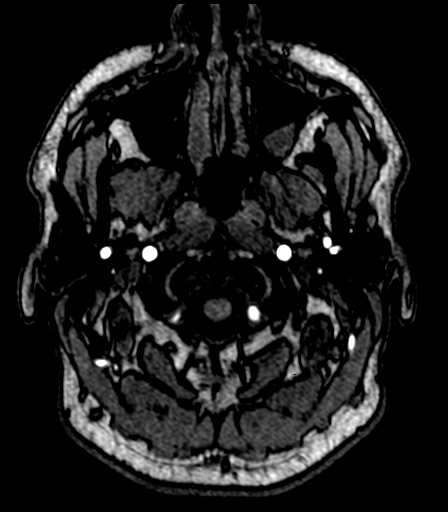
[im 14/156]
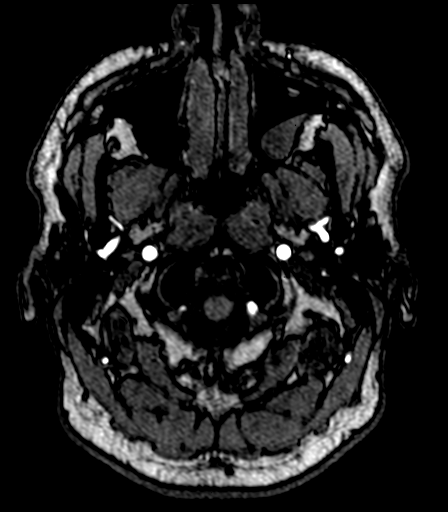
[im 17/156]
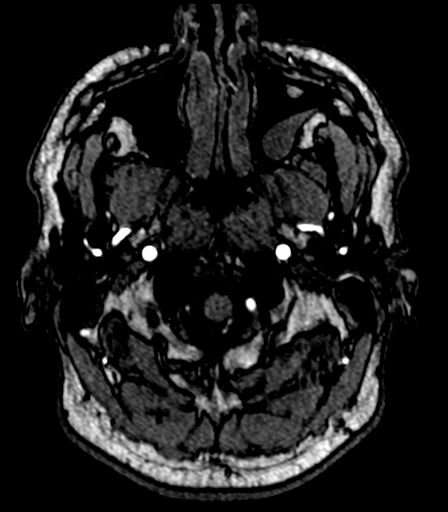
[im 20/156]
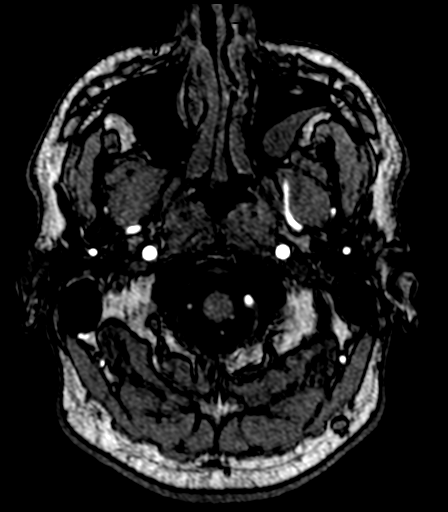
[im 24/156]
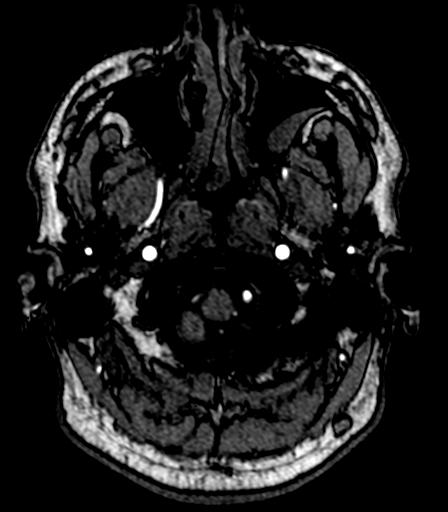
[im 27/156]
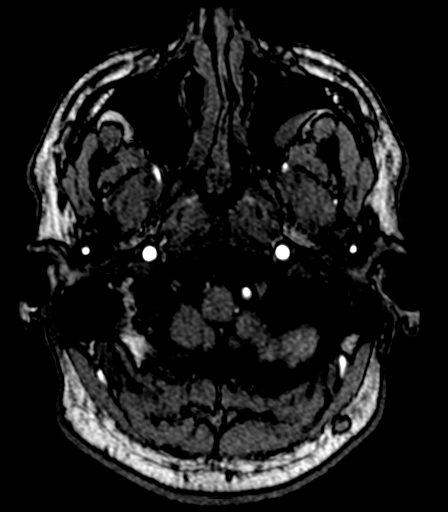
[im 30/156]
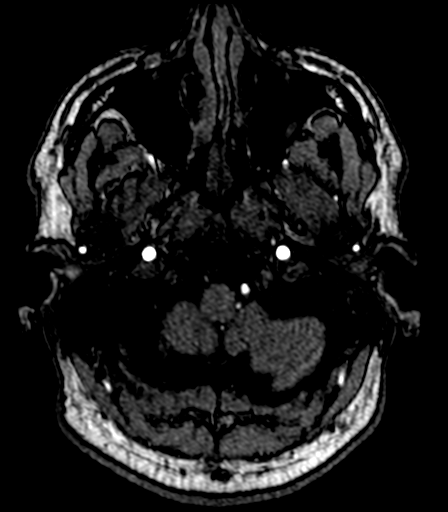
[im 33/156]
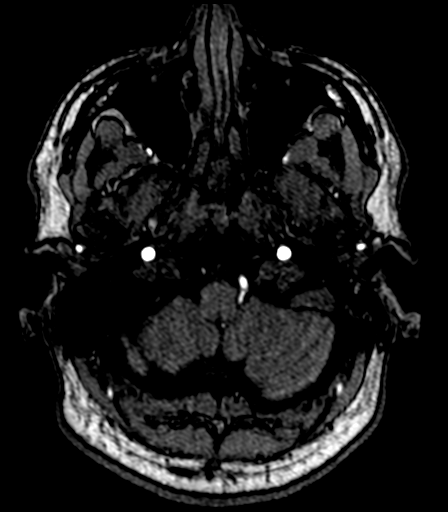
[im 37/156]
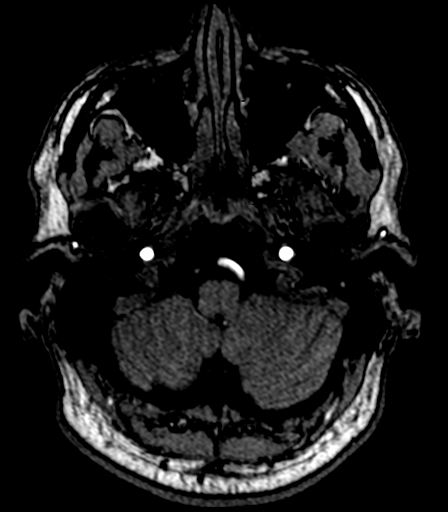
[im 40/156]
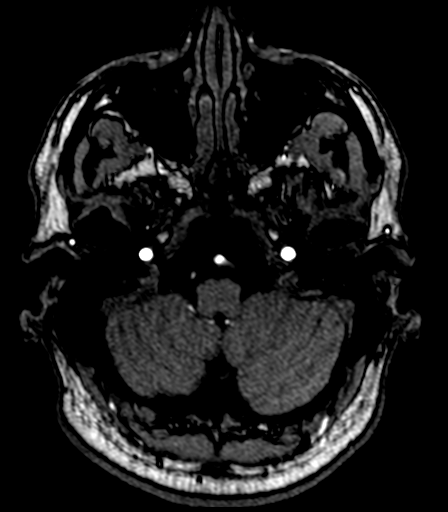
[im 43/156]
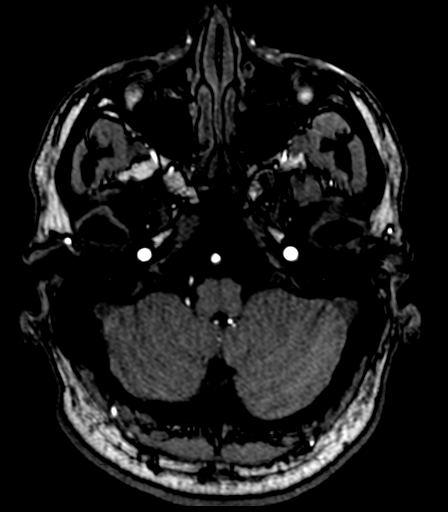
[im 47/156]
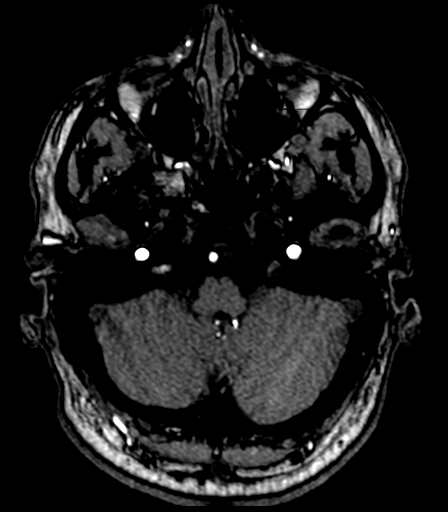
[im 50/156]
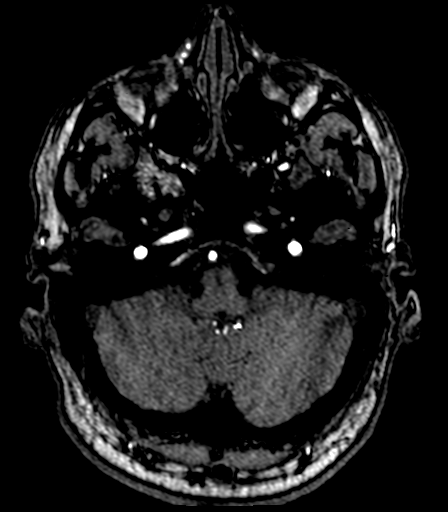
[im 70/156]
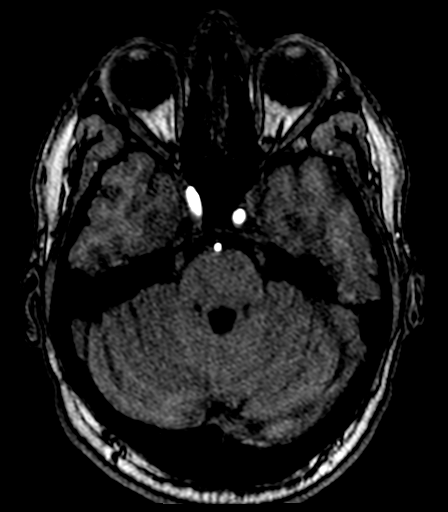
[im 80/156]
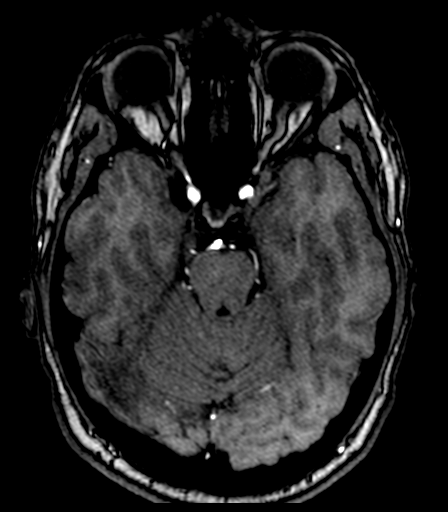
[im 90/156]
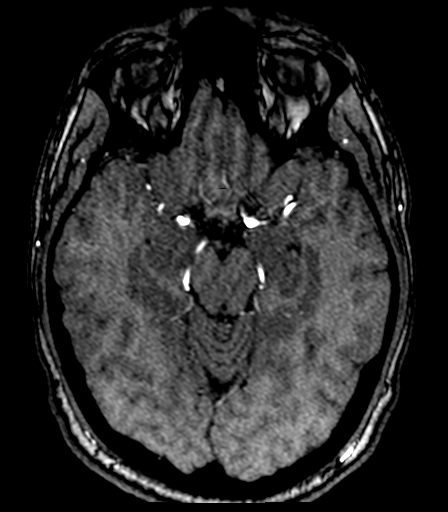
[im 109/156]
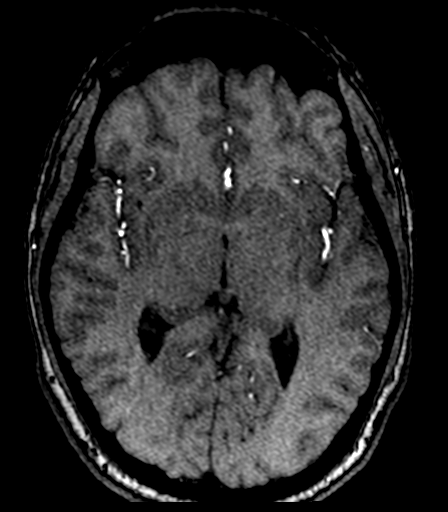
[im 129/156]
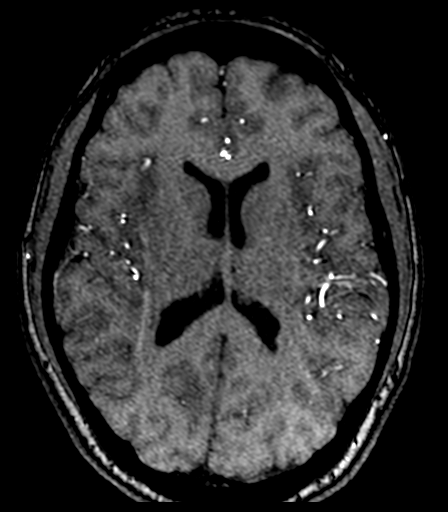
[im 132/156]
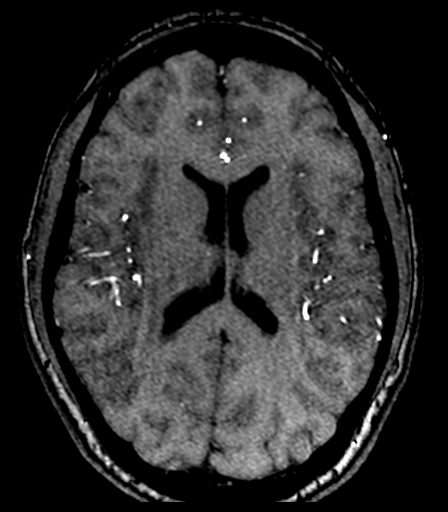
[im 149/156]
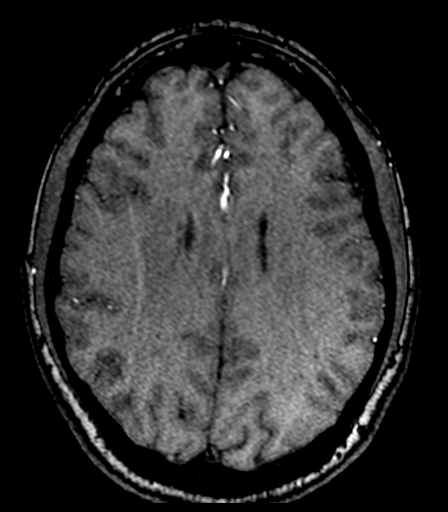

[23 of 48 positions shown; findings below may reference images not displayed]

FINDINGS: Both internal carotid arteries are widely patent into the brain. No
siphon stenosis. The anterior and middle cerebral vessels are patent
without proximal stenosis, aneurysm or vascular malformation.

Both vertebral arteries are widely patent. The right vertebral
artery is a small vessel that terminates in PICA. Left vertebral
artery supplies the basilar. No basilar stenosis. Posterior
circulation branch vessels appear normal.
IMPRESSION: Normal examination. No vascular abnormality to explain the
presenting symptoms.

## 2021-07-02 ENCOUNTER — Ambulatory Visit (INDEPENDENT_AMBULATORY_CARE_PROVIDER_SITE_OTHER): Payer: 59 | Admitting: Nurse Practitioner

## 2021-07-02 ENCOUNTER — Encounter: Payer: Self-pay | Admitting: Nurse Practitioner

## 2021-07-02 VITALS — BP 114/73 | HR 97 | Temp 97.3°F | Ht 72.05 in | Wt 250.5 lb

## 2021-07-02 DIAGNOSIS — Z6833 Body mass index (BMI) 33.0-33.9, adult: Secondary | ICD-10-CM | POA: Diagnosis not present

## 2021-07-02 DIAGNOSIS — Z7689 Persons encountering health services in other specified circumstances: Secondary | ICD-10-CM | POA: Diagnosis not present

## 2021-07-02 DIAGNOSIS — R1319 Other dysphagia: Secondary | ICD-10-CM | POA: Diagnosis not present

## 2021-07-02 DIAGNOSIS — G43909 Migraine, unspecified, not intractable, without status migrainosus: Secondary | ICD-10-CM

## 2021-07-02 DIAGNOSIS — Z6834 Body mass index (BMI) 34.0-34.9, adult: Secondary | ICD-10-CM | POA: Insufficient documentation

## 2021-07-02 MED ORDER — METOCLOPRAMIDE HCL 5 MG PO TABS: 5.0000 mg | ORAL_TABLET | Freq: Three times a day (TID) | ORAL | 1 refills | Status: DC

## 2021-07-02 MED ORDER — ELETRIPTAN HYDROBROMIDE 20 MG PO TABS: 20.0000 mg | ORAL_TABLET | ORAL | 0 refills | Status: DC | PRN

## 2021-07-02 NOTE — Progress Notes (Signed)
? ?New Patient Office Visit ? ?Subjective   ? ?Patient ID: Scott Mitchell, male    DOB: 08-02-1990  Age: 31 y.o. MRN: [id number] ? ?CC:  ?Chief Complaint  ?Patient presents with  ? New Patient (Initial Visit)  ? ? ?HPI ?Scott Mitchell presents to establish care ?The patient states that he has trouble with swallowing. There are times when he feels like he is choking on food. Will generally have to have a large amount of water to get food to go down. Recently, water doesn't;t feel like it is going down either. States that this has been going on for two years. Gradually getting worse. Liquids are not a problem. States that rice is one of the biggest problem foods. After eating, he tends to feel like he has mucus caught in his throat and has to clear his throat often. This feeling has caused him to throw up in the past.  ?He is getting ready to start using topical finasteride and minoxidil to help with hair loss.  ?Migraine headaches - gets two or three each month. Has not used anything except for excedrin to help. Not generally very effective. Gets light sensitivity. Denies nausea and vomiting associated with migraines . ? ? ?Outpatient Encounter Medications as of 07/02/2021  ?Medication Sig  ? eletriptan (RELPAX) 20 MG tablet Take 1 tablet (20 mg total) by mouth as needed for migraine or headache. May repeat in 2 hours if headache persists or recurs.  ? metoCLOPramide (REGLAN) 5 MG tablet Take 1 tablet (5 mg total) by mouth 4 (four) times daily -  before meals and at bedtime.  ? [DISCONTINUED] buPROPion (WELLBUTRIN SR) 150 MG 12 hr tablet TAKE 1 TABLET BY MOUTH TWICE A DAY  ? [DISCONTINUED] cyclobenzaprine (FLEXERIL) 10 MG tablet Take 1 tablet (10 mg total) by mouth at bedtime. (Patient taking differently: Take 10 mg by mouth at bedtime as needed. )  ? [DISCONTINUED] ibuprofen (ADVIL,MOTRIN) 800 MG tablet Take 1 tablet (800 mg total) by mouth 3 (three) times daily.  ? [DISCONTINUED] Vitamin D, Ergocalciferol,  (DRISDOL) 1.25 MG (50000 UT) CAPS capsule Take 1 capsule (50,000 Units total) by mouth every 7 (seven) days.  ? ?No facility-administered encounter medications on file as of 07/02/2021.  ? ? ?Past Medical History:  ?Diagnosis Date  ? Anxiety   ? Depression   ? Hemorrhoids   ? ? ?Past Surgical History:  ?Procedure Laterality Date  ? ADENOIDECTOMY    ? EYE SURGERY    ? ? ?Family History  ?Problem Relation Age of Onset  ? Hypothyroidism Mother   ? Hypertension Father   ? Healthy Sister   ? ? ?Social History  ? ?Socioeconomic History  ? Marital status: Single  ?  Spouse name: Not on file  ? Number of children: 0  ? Years of education: Not on file  ? Highest education level: Bachelor's degree (e.g., BA, AB, BS)  ?Occupational History  ? Not on file  ?Tobacco Use  ? Smoking status: Former  ?  Types: Cigarettes  ? Smokeless tobacco: Never  ?Vaping Use  ? Vaping Use: Never used  ?Substance and Sexual Activity  ? Alcohol use: No  ? Drug use: Yes  ?  Types: Marijuana  ?  Comment: Per pt DC'd approximately 04/13/17  ? Sexual activity: Not Currently  ?Other Topics Concern  ? Not on file  ?Social History Narrative  ? Not on file  ? ?Social Determinants of Health  ? ?Financial Resource Strain: Not on  file  ?Food Insecurity: Not on file  ?Transportation Needs: Not on file  ?Physical Activity: Not on file  ?Stress: Not on file  ?Social Connections: Not on file  ?Intimate Partner Violence: Not on file  ? ? ?Review of Systems  ?Constitutional:  Negative for chills and fever.  ?HENT:  Negative for congestion and sinus pain.   ?Eyes: Negative.   ?Respiratory:  Negative for cough and wheezing.   ?Cardiovascular:  Negative for chest pain and palpitations.  ?Gastrointestinal:  Negative for constipation.  ?     Difficulty swallowing.   ?Genitourinary: Negative.   ?Musculoskeletal:  Negative for back pain and myalgias.  ?Skin:  Negative for itching and rash.  ?Neurological:  Positive for headaches. Negative for dizziness and weakness.  ?      Migraine headaches a few times each month   ?Psychiatric/Behavioral:  Negative for depression. The patient is not nervous/anxious.   ? ?  ? ? ?Objective   ? ?Today's Vitals  ? 07/02/21 1058  ?BP: 114/73  ?Pulse: 97  ?Temp: (!) 97.3 ?F (36.3 ?C)  ?SpO2: 99%  ?Weight: 250 lb 8 oz (113.6 kg)  ?Height: 6' 0.05" (1.83 m)  ? ?Body mass index is 33.93 kg/m?.  ? ?Physical Exam ?Vitals and nursing note reviewed.  ?Constitutional:   ?   Appearance: Normal appearance. He is well-developed.  ?HENT:  ?   Head: Normocephalic and atraumatic.  ?   Nose: Nose normal.  ?   Mouth/Throat:  ?   Mouth: Mucous membranes are moist.  ?   Pharynx: Oropharynx is clear.  ?Eyes:  ?   Extraocular Movements: Extraocular movements intact.  ?   Conjunctiva/sclera: Conjunctivae normal.  ?   Pupils: Pupils are equal, round, and reactive to light.  ?Cardiovascular:  ?   Rate and Rhythm: Normal rate and regular rhythm.  ?   Pulses: Normal pulses.  ?   Heart sounds: Normal heart sounds.  ?Pulmonary:  ?   Effort: Pulmonary effort is normal.  ?   Breath sounds: Normal breath sounds.  ?Abdominal:  ?   Palpations: Abdomen is soft.  ?Musculoskeletal:     ?   General: Normal range of motion.  ?   Cervical back: Normal range of motion and neck supple.  ?Lymphadenopathy:  ?   Cervical: No cervical adenopathy.  ?Skin: ?   General: Skin is warm and dry.  ?   Capillary Refill: Capillary refill takes less than 2 seconds.  ?Neurological:  ?   General: No focal deficit present.  ?   Mental Status: He is alert and oriented to person, place, and time.  ?Psychiatric:     ?   Mood and Affect: Mood normal.     ?   Behavior: Behavior normal.     ?   Thought Content: Thought content normal.     ?   Judgment: Judgment normal.  ? ? ?  ? ?Assessment & Plan:  ?1. Esophageal dysphagia ?Trial reglan 5mg  up to three times daily, taken about 30 minutes prior to meals. Consider referral to GI if ineffective  ?- metoCLOPramide (REGLAN) 5 MG tablet; Take 1 tablet (5 mg total) by mouth  4 (four) times daily -  before meals and at bedtime.  Dispense: 90 tablet; Refill: 1 ? ?2. Acute migraine ?Trial relpax. Take at onset of migraine. May repeat dose in two hours if needed for persistent symptoms.  ?- eletriptan (RELPAX) 20 MG tablet; Take 1 tablet (20 mg total) by  mouth as needed for migraine or headache. May repeat in 2 hours if headache persists or recurs.  Dispense: 10 tablet; Refill: 0 ? ?3. Body mass index (BMI) of 33.0-33.9 in adult ?Encourage patient to limit calorie intake to 2000 cal/day or less.  He should consume a low cholesterol, low-fat diet.   ? ?4. Encounter to establish care ?Appointment today to establish new primary care provider   ? ?  ?Problem List Items Addressed This Visit   ? ?  ? Cardiovascular and Mediastinum  ? Acute migraine  ? Relevant Medications  ? eletriptan (RELPAX) 20 MG tablet  ?  ? Digestive  ? Esophageal dysphagia - Primary  ? Relevant Medications  ? metoCLOPramide (REGLAN) 5 MG tablet  ?  ? Other  ? Body mass index (BMI) of 33.0-33.9 in adult  ? ?Other Visit Diagnoses   ? ? Encounter to establish care      ? ?  ? ? ?Return in about 4 weeks (around 07/30/2021) for health maintenance exam, FBW at time of visit.  ? ?Carlean Jews, NP ? ? ?

## 2021-08-02 ENCOUNTER — Encounter: Payer: Self-pay | Admitting: Nurse Practitioner

## 2021-08-02 ENCOUNTER — Ambulatory Visit (INDEPENDENT_AMBULATORY_CARE_PROVIDER_SITE_OTHER): Payer: 59 | Admitting: Nurse Practitioner

## 2021-08-02 VITALS — BP 121/75 | HR 74 | Temp 97.0°F | Ht 72.05 in | Wt 251.1 lb

## 2021-08-02 DIAGNOSIS — R1319 Other dysphagia: Secondary | ICD-10-CM

## 2021-08-02 DIAGNOSIS — Z1321 Encounter for screening for nutritional disorder: Secondary | ICD-10-CM

## 2021-08-02 DIAGNOSIS — Z0000 Encounter for general adult medical examination without abnormal findings: Secondary | ICD-10-CM

## 2021-08-02 DIAGNOSIS — G43909 Migraine, unspecified, not intractable, without status migrainosus: Secondary | ICD-10-CM | POA: Diagnosis not present

## 2021-08-02 DIAGNOSIS — Z1329 Encounter for screening for other suspected endocrine disorder: Secondary | ICD-10-CM

## 2021-08-02 DIAGNOSIS — Z6834 Body mass index (BMI) 34.0-34.9, adult: Secondary | ICD-10-CM | POA: Diagnosis not present

## 2021-08-02 DIAGNOSIS — Z0001 Encounter for general adult medical examination with abnormal findings: Secondary | ICD-10-CM | POA: Diagnosis not present

## 2021-08-02 DIAGNOSIS — Z13228 Encounter for screening for other metabolic disorders: Secondary | ICD-10-CM

## 2021-08-02 DIAGNOSIS — Z130 Encounter for screening for diseases of the blood and blood-forming organs and certain disorders involving the immune mechanism: Secondary | ICD-10-CM

## 2021-08-02 NOTE — Progress Notes (Signed)
Established patient visit   Patient: Scott Mitchell   DOB: 1990-08-27   31 y.o. Male  MRN: [id number] Visit Date: 08/02/2021   Chief Complaint  Patient presents with   Annual Exam   Subjective    HPI  The patient is here for annual physical  -history of migraines. Added prescription for relpax. States that this has worked very well for him. Has only had to use once, but this medication relieved the headache completely.  -did trial reglan. Did not help so much. Patient has made some changes. Slowed down eating, taking smaller bites. Has noted some improvement  -no new concerns or complaints today  -due to have routine, fasting blood work today.    Medications: Outpatient Medications Prior to Visit  Medication Sig   eletriptan (RELPAX) 20 MG tablet Take 1 tablet (20 mg total) by mouth as needed for migraine or headache. May repeat in 2 hours if headache persists or recurs.   [DISCONTINUED] metoCLOPramide (REGLAN) 5 MG tablet Take 1 tablet (5 mg total) by mouth 4 (four) times daily -  before meals and at bedtime.   No facility-administered medications prior to visit.    Review of Systems  Constitutional:  Negative for activity change, chills, fatigue and fever.  HENT:  Positive for trouble swallowing. Negative for congestion, postnasal drip, rhinorrhea, sinus pressure, sinus pain, sneezing and sore throat.        Trouble swallowing has improved with making some lifestyle changes, taking smaller bites and eating slower.   Eyes: Negative.   Respiratory:  Negative for cough, shortness of breath and wheezing.   Cardiovascular:  Negative for chest pain and palpitations.  Gastrointestinal:  Negative for constipation, diarrhea, nausea and vomiting.  Endocrine: Negative for cold intolerance, heat intolerance, polydipsia and polyuria.  Genitourinary:  Negative for dysuria, frequency and urgency.  Musculoskeletal:  Negative for back pain and myalgias.  Skin:  Negative for rash.   Allergic/Immunologic: Negative for environmental allergies.  Neurological:  Positive for headaches. Negative for dizziness and weakness.       Migraine headaches are relieved by taking prescribed relpax.  Psychiatric/Behavioral:  The patient is not nervous/anxious.       Objective     Today's Vitals   08/02/21 0807  BP: 121/75  Pulse: 74  Temp: (!) 97 F (36.1 C)  SpO2: 97%  Weight: 251 lb 1.9 oz (113.9 kg)  Height: 6' 0.05" (1.83 m)   Body mass index is 34.01 kg/m.   BP Readings from Last 3 Encounters:  08/02/21 121/75  07/02/21 114/73  04/13/18 136/85    Wt Readings from Last 3 Encounters:  08/02/21 251 lb 1.9 oz (113.9 kg)  07/02/21 250 lb 8 oz (113.6 kg)  04/13/18 261 lb 1.9 oz (118.4 kg)    Physical Exam Vitals and nursing note reviewed.  Constitutional:      Appearance: Normal appearance. He is well-developed.  HENT:     Head: Normocephalic and atraumatic.     Right Ear: Tympanic membrane, ear canal and external ear normal.     Left Ear: Tympanic membrane, ear canal and external ear normal.     Nose: Nose normal.     Mouth/Throat:     Mouth: Mucous membranes are moist.     Pharynx: Oropharynx is clear.  Eyes:     Extraocular Movements: Extraocular movements intact.     Conjunctiva/sclera: Conjunctivae normal.     Pupils: Pupils are equal, round, and reactive to light.  Cardiovascular:  Rate and Rhythm: Normal rate and regular rhythm.     Pulses: Normal pulses.     Heart sounds: Normal heart sounds.  Pulmonary:     Effort: Pulmonary effort is normal.     Breath sounds: Normal breath sounds.  Abdominal:     General: Bowel sounds are normal. There is no distension.     Palpations: Abdomen is soft. There is no mass.     Tenderness: There is no abdominal tenderness. There is no right CVA tenderness, left CVA tenderness, guarding or rebound.     Hernia: No hernia is present.  Musculoskeletal:        General: Normal range of motion.     Cervical  back: Normal range of motion and neck supple.  Lymphadenopathy:     Cervical: No cervical adenopathy.  Skin:    General: Skin is warm and dry.     Capillary Refill: Capillary refill takes less than 2 seconds.  Neurological:     General: No focal deficit present.     Mental Status: He is alert and oriented to person, place, and time.  Psychiatric:        Mood and Affect: Mood normal.        Behavior: Behavior normal.        Thought Content: Thought content normal.        Judgment: Judgment normal.     Assessment & Plan    1. Encounter for general adult medical examination with abnormal findings Annual physical today.   2. Acute migraine Relpax effective may take as needed and as prescribed. Will refill as needed   3. Esophageal dysphagia Improved. A GI referral was offered and patient wishes to hold off on this for now. Will reassess as needed.   4. Body mass index (BMI) of 34.0-34.9 in adult Encourage patient to limit calorie intake to 2000 cal/day or less.  He should consume a low cholesterol, low-fat diet.  Recommend he incorporate exercise into his daily routine.   5. Healthcare maintenance Routine, fasting labs drawn during today's visit.  - TSH; Future - Hemoglobin A1c; Future - CBC with Differential/Platelet; Future - Lipid panel; Future - Comp Met (CMET); Future - Comp Met (CMET) - Lipid panel - CBC with Differential/Platelet - Hemoglobin A1c - TSH  6. Screening for endocrine, nutritional, metabolic and immunity disorder Routine, fasting labs drawn during today's visit.  - TSH; Future - Hemoglobin A1c; Future - CBC with Differential/Platelet; Future - Lipid panel; Future - Comp Met (CMET); Future - Comp Met (CMET) - Lipid panel - CBC with Differential/Platelet - Hemoglobin A1c - TSH    Problem List Items Addressed This Visit       Cardiovascular and Mediastinum   Acute migraine     Digestive   Esophageal dysphagia     Other   Healthcare  maintenance   Relevant Orders   TSH   Hemoglobin A1c   CBC with Differential/Platelet   Lipid panel   Comp Met (CMET)   Body mass index (BMI) of 34.0-34.9 in adult   Other Visit Diagnoses     Encounter for general adult medical examination with abnormal findings    -  Primary   Screening for endocrine, nutritional, metabolic and immunity disorder       Relevant Orders   TSH   Hemoglobin A1c   CBC with Differential/Platelet   Lipid panel   Comp Met (CMET)        Return in about 1 year (around  08/03/2022) for health maintenance exam, FBW at time of visit.         Ronnell Freshwater, NP  [facility] at [facility] [phone] (phone) [phone] (fax)  [place]

## 2021-08-03 LAB — COMPREHENSIVE METABOLIC PANEL
ALT: 32 IU/L (ref 0–44)
AST: 18 IU/L (ref 0–40)
Albumin/Globulin Ratio: 2.2 (ref 1.2–2.2)
Albumin: 5 g/dL (ref 4.0–5.0)
Alkaline Phosphatase: 40 IU/L — ABNORMAL LOW (ref 44–121)
BUN/Creatinine Ratio: 15 (ref 9–20)
BUN: 19 mg/dL (ref 6–20)
Bilirubin Total: 0.7 mg/dL (ref 0.0–1.2)
CO2: 24 mmol/L (ref 20–29)
Calcium: 9.7 mg/dL (ref 8.7–10.2)
Chloride: 100 mmol/L (ref 96–106)
Creatinine, Ser: 1.27 mg/dL (ref 0.76–1.27)
Globulin, Total: 2.3 g/dL (ref 1.5–4.5)
Glucose: 86 mg/dL (ref 70–99)
Potassium: 3.9 mmol/L (ref 3.5–5.2)
Sodium: 140 mmol/L (ref 134–144)
Total Protein: 7.3 g/dL (ref 6.0–8.5)
eGFR: 77 mL/min/{1.73_m2} (ref >59)

## 2021-08-03 LAB — CBC WITH DIFFERENTIAL/PLATELET
Basophils Absolute: 0.1 10*3/uL (ref 0.0–0.2)
Basos: 1 %
EOS (ABSOLUTE): 0.6 10*3/uL — ABNORMAL HIGH (ref 0.0–0.4)
Eos: 10 %
Hematocrit: 45.1 % (ref 37.5–51.0)
Hemoglobin: 15.2 g/dL (ref 13.0–17.7)
Immature Grans (Abs): 0 10*3/uL (ref 0.0–0.1)
Immature Granulocytes: 1 %
Lymphocytes Absolute: 1.5 10*3/uL (ref 0.7–3.1)
Lymphs: 24 %
MCH: 29.1 pg (ref 26.6–33.0)
MCHC: 33.7 g/dL (ref 31.5–35.7)
MCV: 86 fL (ref 79–97)
Monocytes Absolute: 0.4 10*3/uL (ref 0.1–0.9)
Monocytes: 7 %
Neutrophils Absolute: 3.7 10*3/uL (ref 1.4–7.0)
Neutrophils: 57 %
Platelets: 163 10*3/uL (ref 150–450)
RBC: 5.22 x10E6/uL (ref 4.14–5.80)
RDW: 13.2 % (ref 11.6–15.4)
WBC: 6.4 10*3/uL (ref 3.4–10.8)

## 2021-08-03 LAB — LIPID PANEL
Chol/HDL Ratio: 7.2 ratio — ABNORMAL HIGH (ref 0.0–5.0)
Cholesterol, Total: 210 mg/dL — ABNORMAL HIGH (ref 100–199)
HDL: 29 mg/dL — ABNORMAL LOW (ref >39)
LDL Chol Calc (NIH): 138 mg/dL — ABNORMAL HIGH (ref 0–99)
Triglycerides: 239 mg/dL — ABNORMAL HIGH (ref 0–149)
VLDL Cholesterol Cal: 43 mg/dL — ABNORMAL HIGH (ref 5–40)

## 2021-08-03 LAB — TSH: TSH: 2.66 u[IU]/mL (ref 0.450–4.500)

## 2021-08-03 LAB — HEMOGLOBIN A1C
Est. average glucose Bld gHb Est-mCnc: 105 mg/dL
Hgb A1c MFr Bld: 5.3 % (ref 4.8–5.6)

## 2021-09-02 NOTE — Progress Notes (Signed)
Please let the patient know that his labs show a moderate elevation of his cholesterol. I would like to start him on rosuvastatin at 5mg  with supper. If he is agreeable, I will send this to his pharmacy. His other labs were good.  Thanks so much.   -HB

## 2021-09-03 NOTE — Progress Notes (Signed)
Thank you. At least it's documented that he does not want to start medication despite the recommendation.

## 2022-03-19 ENCOUNTER — Ambulatory Visit: Payer: Medicaid Other | Admitting: Nurse Practitioner

## 2022-04-21 ENCOUNTER — Encounter: Payer: Self-pay | Admitting: Nurse Practitioner

## 2022-04-21 ENCOUNTER — Ambulatory Visit (INDEPENDENT_AMBULATORY_CARE_PROVIDER_SITE_OTHER): Payer: Medicaid Other | Admitting: Nurse Practitioner

## 2022-04-21 VITALS — BP 131/84 | HR 71 | Ht 72.0 in | Wt 255.0 lb

## 2022-04-21 DIAGNOSIS — Z6834 Body mass index (BMI) 34.0-34.9, adult: Secondary | ICD-10-CM | POA: Diagnosis not present

## 2022-04-21 DIAGNOSIS — F988 Other specified behavioral and emotional disorders with onset usually occurring in childhood and adolescence: Secondary | ICD-10-CM

## 2022-04-21 MED ORDER — AMPHETAMINE-DEXTROAMPHETAMINE 5 MG PO TABS: 5.0000 mg | ORAL_TABLET | Freq: Every day | ORAL | 0 refills | Status: DC

## 2022-04-21 NOTE — Progress Notes (Signed)
Established patient visit   Patient: Scott Mitchell   DOB: Aug 25, 1990   32 y.o. Male  MRN: [id number] Visit Date: 04/21/2022   Chief Complaint  Patient presents with   ADHD   Subjective    HPI  Follow up  -struggling to find motivation and drive.  -he is a Market researcher.  -he is taking online classes for cyber security.  -finding it difficult to stay on task  -finishing assignments on time is also a problem noted .  Blood pressure is elevated during today's office visit.    Medications: Outpatient Medications Prior to Visit  Medication Sig   eletriptan (RELPAX) 20 MG tablet Take 1 tablet (20 mg total) by mouth as needed for migraine or headache. May repeat in 2 hours if headache persists or recurs.   No facility-administered medications prior to visit.    Review of Systems See HPI      Objective     Today's Vitals   04/21/22 1400 04/21/22 1438  BP: (Abnormal) 162/92 131/84  Pulse: 71   SpO2: 98%   Weight: 255 lb (115.7 kg)   Height: 6' (1.829 m)    Body mass index is 34.58 kg/m.  BP Readings from Last 3 Encounters:  04/21/22 131/84  08/02/21 121/75  07/02/21 114/73    Wt Readings from Last 3 Encounters:  04/21/22 255 lb (115.7 kg)  08/02/21 251 lb 1.9 oz (113.9 kg)  07/02/21 250 lb 8 oz (113.6 kg)    Physical Exam Vitals and nursing note reviewed.  Constitutional:      Appearance: Normal appearance. He is well-developed.  HENT:     Head: Normocephalic and atraumatic.     Nose: Nose normal.     Mouth/Throat:     Mouth: Mucous membranes are moist.     Pharynx: Oropharynx is clear.  Eyes:     Extraocular Movements: Extraocular movements intact.     Conjunctiva/sclera: Conjunctivae normal.     Pupils: Pupils are equal, round, and reactive to light.  Cardiovascular:     Rate and Rhythm: Normal rate and regular rhythm.     Pulses: Normal pulses.     Heart sounds: Normal heart sounds.  Pulmonary:     Effort: Pulmonary effort is normal.      Breath sounds: Normal breath sounds.  Abdominal:     Palpations: Abdomen is soft.  Musculoskeletal:        General: Normal range of motion.     Cervical back: Normal range of motion and neck supple.  Lymphadenopathy:     Cervical: No cervical adenopathy.  Skin:    General: Skin is warm and dry.     Capillary Refill: Capillary refill takes less than 2 seconds.  Neurological:     General: No focal deficit present.     Mental Status: He is alert and oriented to person, place, and time.  Psychiatric:        Mood and Affect: Mood normal.        Behavior: Behavior normal.        Thought Content: Thought content normal.        Judgment: Judgment normal.       Assessment & Plan    1. BMI 34.0-34.9,adult Encourage patient to limit calorie intake to 2000 cal/day or less.  He should consume a low cholesterol, low-fat diet.  Patient incorporate exercise into his daily routine.   2. Attention deficit disorder (ADD) without hyperactivity Trial adderall 5 mg daily as needed.  Will reassess in 4 weeks. Adjust  dosing as indicated.  - amphetamine-dextroamphetamine (ADDERALL) 5 MG tablet; Take 1 tablet (5 mg total) by mouth daily.  Dispense: 60 tablet; Refill: 0   Problem List Items Addressed This Visit       Other   BMI 34.0-34.9,adult - Primary   Attention deficit disorder (ADD) without hyperactivity   Relevant Medications   amphetamine-dextroamphetamine (ADDERALL) 5 MG tablet     Return in about 4 weeks (around 05/19/2022) for ADD.         Ronnell Freshwater, NP  [facility] at [facility] [phone] (phone) [phone] (fax)  [place]

## 2022-05-18 DIAGNOSIS — F988 Other specified behavioral and emotional disorders with onset usually occurring in childhood and adolescence: Secondary | ICD-10-CM | POA: Insufficient documentation

## 2022-05-18 NOTE — Progress Notes (Signed)
Established patient visit   Patient: Scott Mitchell   DOB: [birth year]   32 y.o. Male  MRN: [id number] Visit Date: 05/19/2022   No chief complaint on file.  Subjective    HPI  Follow up  -ADD  --started on adderall 5 mg  twice daily as needed.  -?improvement in symptoms  -?negative side effects..    Medications: Outpatient Medications Prior to Visit  Medication Sig   amphetamine-dextroamphetamine (ADDERALL) 5 MG tablet Take 1 tablet (5 mg total) by mouth daily.   eletriptan (RELPAX) 20 MG tablet Take 1 tablet (20 mg total) by mouth as needed for migraine or headache. May repeat in 2 hours if headache persists or recurs.   No facility-administered medications prior to visit.    Review of Systems  {Labs (Optional):23779}   Objective    There were no vitals filed for this visit. There is no height or weight on file to calculate BMI.  BP Readings from Last 3 Encounters:  04/21/22 131/84  08/02/21 121/75  07/02/21 114/73    Wt Readings from Last 3 Encounters:  04/21/22 255 lb (115.7 kg)  08/02/21 251 lb 1.9 oz (113.9 kg)  07/02/21 250 lb 8 oz (113.6 kg)    Physical Exam  ***  No results found for any visits on 05/19/22.  Assessment & Plan     Problem List Items Addressed This Visit   None    No follow-ups on file.         Ronnell Freshwater, NP  [facility] at [facility] [phone] (phone) [phone] (fax)  [place]

## 2022-05-19 ENCOUNTER — Ambulatory Visit (INDEPENDENT_AMBULATORY_CARE_PROVIDER_SITE_OTHER): Payer: Medicaid Other | Admitting: Nurse Practitioner

## 2022-05-19 ENCOUNTER — Encounter: Payer: Self-pay | Admitting: Nurse Practitioner

## 2022-05-19 VITALS — BP 121/84 | HR 87 | Ht 72.0 in | Wt 247.4 lb

## 2022-05-19 DIAGNOSIS — G43909 Migraine, unspecified, not intractable, without status migrainosus: Secondary | ICD-10-CM

## 2022-05-19 DIAGNOSIS — F988 Other specified behavioral and emotional disorders with onset usually occurring in childhood and adolescence: Secondary | ICD-10-CM | POA: Diagnosis not present

## 2022-05-19 MED ORDER — AMPHETAMINE-DEXTROAMPHETAMINE 10 MG PO TABS: 10.0000 mg | ORAL_TABLET | Freq: Two times a day (BID) | ORAL | 0 refills | Status: DC | PRN

## 2022-06-19 ENCOUNTER — Ambulatory Visit (INDEPENDENT_AMBULATORY_CARE_PROVIDER_SITE_OTHER): Payer: Medicaid Other | Admitting: Nurse Practitioner

## 2022-06-19 ENCOUNTER — Encounter: Payer: Self-pay | Admitting: Nurse Practitioner

## 2022-06-19 VITALS — BP 115/81 | HR 94 | Ht 72.0 in | Wt 247.4 lb

## 2022-06-19 DIAGNOSIS — F988 Other specified behavioral and emotional disorders with onset usually occurring in childhood and adolescence: Secondary | ICD-10-CM | POA: Diagnosis not present

## 2022-06-19 DIAGNOSIS — G43909 Migraine, unspecified, not intractable, without status migrainosus: Secondary | ICD-10-CM | POA: Diagnosis not present

## 2022-06-19 MED ORDER — AMPHETAMINE-DEXTROAMPHETAMINE 10 MG PO TABS: 10.0000 mg | ORAL_TABLET | Freq: Two times a day (BID) | ORAL | 0 refills | Status: DC | PRN

## 2022-06-19 NOTE — Progress Notes (Signed)
Established patient visit   Patient: Scott Mitchell   DOB: 1990/09/21   32 y.o. Male  MRN: [id number] Visit Date: 06/19/2022   Chief Complaint  Patient presents with   Medical Management of Chronic Issues   Subjective    HPI  Follow up  -increased dose adderall to 10 mg tablets.  -he states that he is doing better on current dose adderall. He is able to concentrate better and focus while at work.  -He-denies chest pain, chest pressure, palpitations, or jitteriness. Has had intermittent trouble with sleep.  -stable migraines. States that he has not had a lot of migraine headaches.     Medications: Outpatient Medications Prior to Visit  Medication Sig   eletriptan (RELPAX) 20 MG tablet Take 1 tablet (20 mg total) by mouth as needed for migraine or headache. May repeat in 2 hours if headache persists or recurs.   [DISCONTINUED] amphetamine-dextroamphetamine (ADDERALL) 10 MG tablet Take 1 tablet (10 mg total) by mouth 2 (two) times daily as needed.   No facility-administered medications prior to visit.    Review of Systems See HPI      Objective     Today's Vitals   06/19/22 1359 06/19/22 1411  BP: 115/81   Pulse: (Abnormal) 187 94  SpO2: 97%   Weight: 247 lb 6.4 oz (112.2 kg)   Height: 6' (1.829 m)    Body mass index is 33.55 kg/m.  BP Readings from Last 3 Encounters:  06/19/22 115/81  05/19/22 121/84  04/21/22 131/84    Wt Readings from Last 3 Encounters:  06/19/22 247 lb 6.4 oz (112.2 kg)  05/19/22 247 lb 6.4 oz (112.2 kg)  04/21/22 255 lb (115.7 kg)    Physical Exam Vitals and nursing note reviewed.  Constitutional:      Appearance: Normal appearance. He is well-developed.  HENT:     Head: Normocephalic and atraumatic.     Nose: Nose normal.     Mouth/Throat:     Mouth: Mucous membranes are moist.     Pharynx: Oropharynx is clear.  Eyes:     Extraocular Movements: Extraocular movements intact.     Conjunctiva/sclera: Conjunctivae normal.      Pupils: Pupils are equal, round, and reactive to light.  Neck:     Vascular: No carotid bruit.  Cardiovascular:     Rate and Rhythm: Normal rate and regular rhythm.     Pulses: Normal pulses.     Heart sounds: Normal heart sounds.  Pulmonary:     Effort: Pulmonary effort is normal.     Breath sounds: Normal breath sounds.  Abdominal:     Palpations: Abdomen is soft.  Musculoskeletal:        General: Normal range of motion.     Cervical back: Normal range of motion and neck supple.  Lymphadenopathy:     Cervical: No cervical adenopathy.  Skin:    General: Skin is warm and dry.     Capillary Refill: Capillary refill takes less than 2 seconds.  Neurological:     General: No focal deficit present.     Mental Status: He is alert and oriented to person, place, and time.  Psychiatric:        Mood and Affect: Mood normal.        Behavior: Behavior normal.        Thought Content: Thought content normal.        Judgment: Judgment normal.      Assessment & Plan  Acute migraine Assessment & Plan: Stable. Continue to use Relpax as needed and as prescribed. Refill as indicated    Attention deficit disorder (ADD) without hyperactivity Assessment & Plan: Improved. May continue to take adderall 10 mg twice daily as needed. Three 30 day prescriptions were sent to his pharmacy today  Orders: -     Amphetamine-Dextroamphetamine; Take 1 tablet (10 mg total) by mouth 2 (two) times daily as needed.  Dispense: 60 tablet; Refill: 0     Return in about 3 months (around 09/18/2022) for migraines, ADD .         Carlean Jews, NP  [facility] at [facility] [phone] (phone) [phone] (fax)  [facility]

## 2022-07-26 NOTE — Assessment & Plan Note (Signed)
Stable. Continue to use Relpax as needed and as prescribed. Refill as indicated

## 2022-07-26 NOTE — Assessment & Plan Note (Signed)
Improved. May continue to take adderall 10 mg twice daily as needed. Three 30 day prescriptions were sent to his pharmacy today

## 2022-09-02 ENCOUNTER — Other Ambulatory Visit: Payer: Self-pay | Admitting: Nurse Practitioner

## 2022-09-02 DIAGNOSIS — F988 Other specified behavioral and emotional disorders with onset usually occurring in childhood and adolescence: Secondary | ICD-10-CM

## 2022-09-02 MED ORDER — AMPHETAMINE-DEXTROAMPHETAMINE 10 MG PO TABS: 10.0000 mg | ORAL_TABLET | Freq: Two times a day (BID) | ORAL | 0 refills | Status: DC | PRN

## 2022-09-23 ENCOUNTER — Ambulatory Visit: Payer: Medicaid Other | Admitting: Nurse Practitioner

## 2022-09-29 ENCOUNTER — Encounter: Payer: Self-pay | Admitting: Family Medicine

## 2022-09-29 ENCOUNTER — Ambulatory Visit (INDEPENDENT_AMBULATORY_CARE_PROVIDER_SITE_OTHER): Payer: Medicaid Other | Admitting: Family Medicine

## 2022-09-29 VITALS — BP 127/85 | HR 95 | Resp 18 | Ht 72.0 in | Wt 239.0 lb

## 2022-09-29 DIAGNOSIS — G43909 Migraine, unspecified, not intractable, without status migrainosus: Secondary | ICD-10-CM | POA: Diagnosis not present

## 2022-09-29 DIAGNOSIS — M25511 Pain in right shoulder: Secondary | ICD-10-CM

## 2022-09-29 DIAGNOSIS — F988 Other specified behavioral and emotional disorders with onset usually occurring in childhood and adolescence: Secondary | ICD-10-CM

## 2022-09-29 MED ORDER — AMPHETAMINE-DEXTROAMPHETAMINE 10 MG PO TABS: 10.0000 mg | ORAL_TABLET | Freq: Two times a day (BID) | ORAL | 0 refills | Status: DC | PRN

## 2022-09-29 MED ORDER — AMPHETAMINE-DEXTROAMPHETAMINE 10 MG PO TABS
10.0000 mg | ORAL_TABLET | Freq: Two times a day (BID) | ORAL | 0 refills | Status: DC | PRN
Start: 2022-11-24 — End: 2022-12-31

## 2022-09-29 NOTE — Progress Notes (Signed)
   Established Patient Office Visit  Subjective   Patient ID: Scott Mitchell, male    DOB: 1990/04/17  Age: 32 y.o. MRN: [id number]  Chief Complaint  Patient presents with   ADD   Insomnia         HPI Scott Mitchell is a 32 y.o. male presenting today for follow up of ADHD, migraines.  He still has eletriptan left as abortive medication for migraines.  He does not like the way that it makes her feel, so he typically sticks with over-the-counter medications more often for abortive therapy.  He also complains of right shoulder pain that started within the last couple of months.  He has done martial arts his entire life but took a break from it.  He restarted after he started taking Adderall and developed right shoulder pain. ADHD: Reports symptoms are well controlled on Adderall. Denies any problems with insomnia, chest pain, palpitations, or SOB.    ROS Negative unless otherwise noted in HPI   Objective:     BP 127/85 (BP Location: Left Arm, Patient Position: Sitting, Cuff Size: Normal)   Pulse 95   Resp 18   Ht 6' (1.829 m)   Wt 239 lb (108.4 kg)   SpO2 97%   BMI 32.41 kg/m   Physical Exam Constitutional:      General: He is not in acute distress.    Appearance: Normal appearance.  HENT:     Head: Normocephalic and atraumatic.  Cardiovascular:     Rate and Rhythm: Normal rate and regular rhythm.     Heart sounds: Normal heart sounds. No murmur heard.    No friction rub. No gallop.  Pulmonary:     Effort: Pulmonary effort is normal. No respiratory distress.     Breath sounds: Normal breath sounds. No wheezing, rhonchi or rales.  Skin:    General: Skin is warm and dry.  Neurological:     Mental Status: He is alert and oriented to person, place, and time.  Psychiatric:        Mood and Affect: Mood normal.      Assessment & Plan:  Attention deficit disorder (ADD) without hyperactivity Assessment & Plan: Stable.  Continue Adderall 10 mg twice daily.  Sent  three 30-day refills to pharmacy.  Will continue to monitor.  Orders: -     Amphetamine-Dextroamphetamine; Take 1 tablet (10 mg total) by mouth 2 (two) times daily as needed.  Dispense: 60 tablet; Refill: 0 -     Amphetamine-Dextroamphetamine; Take 1 tablet (10 mg total) by mouth 2 (two) times daily as needed.  Dispense: 60 tablet; Refill: 0 -     Amphetamine-Dextroamphetamine; Take 1 tablet (10 mg total) by mouth 2 (two) times daily as needed.  Dispense: 60 tablet; Refill: 0  Acute migraine Assessment & Plan: Stable.  Continue eletriptan 20 mg as needed for abortive therapy.  We also discussed the possibility of trying sumatriptan in the future.  Will continue to monitor.   Acute pain of right shoulder -     Ambulatory referral to Orthopedic Surgery    Return in about 3 months (around 12/30/2022) for annual physical, fasting blood work 1 week before.    Melida Quitter, PA

## 2022-09-29 NOTE — Assessment & Plan Note (Signed)
Stable.  Continue Adderall 10 mg twice daily.  Sent three 30-day refills to pharmacy.  Will continue to monitor.

## 2022-09-29 NOTE — Assessment & Plan Note (Signed)
Stable.  Continue eletriptan 20 mg as needed for abortive therapy.  We also discussed the possibility of trying sumatriptan in the future.  Will continue to monitor.

## 2022-10-06 ENCOUNTER — Encounter: Payer: Self-pay | Admitting: Physician Assistant

## 2022-10-06 ENCOUNTER — Other Ambulatory Visit: Payer: Self-pay

## 2022-10-06 ENCOUNTER — Ambulatory Visit (INDEPENDENT_AMBULATORY_CARE_PROVIDER_SITE_OTHER): Payer: Medicaid Other | Admitting: Sports Medicine

## 2022-10-06 ENCOUNTER — Ambulatory Visit (INDEPENDENT_AMBULATORY_CARE_PROVIDER_SITE_OTHER): Payer: Medicaid Other | Admitting: Physician Assistant

## 2022-10-06 ENCOUNTER — Ambulatory Visit (INDEPENDENT_AMBULATORY_CARE_PROVIDER_SITE_OTHER): Payer: Medicaid Other

## 2022-10-06 DIAGNOSIS — G8929 Other chronic pain: Secondary | ICD-10-CM

## 2022-10-06 DIAGNOSIS — M25512 Pain in left shoulder: Secondary | ICD-10-CM

## 2022-10-06 MED ORDER — METHYLPREDNISOLONE ACETATE 40 MG/ML IJ SUSP
80.0000 mg | INTRAMUSCULAR | Status: AC | PRN
Start: 2022-10-06 — End: 2022-10-06
  Administered 2022-10-06: 80 mg via INTRA_ARTICULAR

## 2022-10-06 MED ORDER — BUPIVACAINE HCL 0.25 % IJ SOLN
2.0000 mL | INTRAMUSCULAR | Status: AC | PRN
Start: 2022-10-06 — End: 2022-10-06
  Administered 2022-10-06: 2 mL via INTRA_ARTICULAR

## 2022-10-06 MED ORDER — LIDOCAINE HCL 1 % IJ SOLN
2.0000 mL | INTRAMUSCULAR | Status: AC | PRN
Start: 2022-10-06 — End: 2022-10-06
  Administered 2022-10-06: 2 mL

## 2022-10-06 NOTE — Progress Notes (Signed)
   Procedure Note  Patient: Scott Mitchell             Date of Birth: 1990/10/16           MRN: [id number]             Visit Date: 10/06/2022  Procedures: Visit Diagnoses:  1. Chronic left shoulder pain    Large Joint Inj: L glenohumeral on 10/06/2022 9:31 AM Indications: pain Details: 22 G 3.5 in needle, ultrasound-guided posterior approach Medications: 2 mL lidocaine 1 %; 2 mL bupivacaine 0.25 %; 80 mg methylPREDNISolone acetate 40 MG/ML Outcome: tolerated well, no immediate complications  US-guided glenohumeral joint injection, left   shoulder After discussion on risks/benefits/indications, informed verbal consent was obtained. A timeout was then performed. The patient was positioned lying lateral recumbent on examination table. The patient's shoulder was prepped with betadine and multiple alcohol swabs and utilizing ultrasound guidance, the patient's glenohumeral joint was identified on ultrasound. Using ultrasound guidance a 22-gauge, 3.5 inch needle with a mixture of 2:2:2 cc's lidocaine:bupivicaine:depomedrol was directed from a lateral to medial direction via in-plane technique into the glenohumeral joint with visualization of appropriate spread of injectate into the joint. Patient tolerated the procedure well without immediate complications.      Procedure, treatment alternatives, risks and benefits explained, specific risks discussed. Consent was given by the patient. Immediately prior to procedure a time out was called to verify the correct patient, procedure, equipment, support staff and site/side marked as required. Patient was prepped and draped in the usual sterile fashion.     - I evaluated the patient about 5 minutes post-injection and he had improvement in pain and range of motion - follow-up with Mardella Layman and/or  Dr. Roda Shutters as indicated; I am happy to see them as needed  Madelyn Brunner, DO Primary Care Sports Medicine Physician  [facility] -  Orthopedics  This note was dictated using Dragon naturally speaking software and may contain errors in syntax, spelling, or content which have not been identified prior to signing this note.

## 2022-10-06 NOTE — Progress Notes (Signed)
Office Visit Note   Patient: Scott Mitchell           Date of Birth: [birth year]           MRN: [id number] Visit Date: 10/06/2022              Requested by: Melida Quitter, PA [address] [place],  [place] [address] PCP: Melida Quitter, PA   Assessment & Plan: Visit Diagnoses:  1. Chronic left shoulder pain     Plan: Impression is anterior left shoulder pain.  Currently not concerned for rotator cuff pathology.  I think he may have some mild biceps tendinitis.  We have discussed backing off of activities as well as starting a course of anti-inflammatories versus glenohumeral cortisone injection.  He has elected to proceed with injection today.  Will make referral to Dr. Shon Baton for this.  He will follow-up with [place] as needed.  Follow-Up Instructions: Return if symptoms worsen or fail to improve.   Orders:  Orders Placed This Encounter  Procedures   XR Shoulder Left   No orders of the defined types were placed in this encounter.     Procedures: No procedures performed   Clinical Data: No additional findings.   Subjective: Chief Complaint  Patient presents with   Left Shoulder - Pain    HPI Patient is a pleasant 32 year old gentleman who comes in today with left shoulder pain.  He denies any injury.  His symptoms started about 3 weeks ago after he had started working out again.  The pain he has is to the anterior shoulder which is described as a dull burn.  Symptoms are worse with forward flexion and external rotation.  He has not taken any medication for this.  Review of Systems as detailed in HPI.  All others reviewed and are negative.   Objective: Vital Signs: There were no vitals taken for this visit.  Physical Exam well-developed well-nourished gentleman in no acute distress.  Alert and oriented x 3.  Ortho Exam left shoulder exam reveals full active forward flexion.  External rotation to approximately 80 degrees.  Internal rotation to T12.   Negative empty can test. He does have mild pain with speeds and O'Brien's testing.  Full strength throughout.  He is neurovascularly intact distally.   Comments:  No specialty comments available.  Imaging: XR Shoulder Left  Result Date: 10/06/2022 X-rays demonstrate moderate degenerative changes to the [facility] joint.  Type II acromion.    PMFS History: Patient Active Problem List   Diagnosis Date Noted   Attention deficit disorder (ADD) without hyperactivity 05/18/2022   Esophageal dysphagia 07/02/2021   Depression, recurrent (HCC) 04/13/2018   Cervical pain (neck) 05/04/2017   Acute migraine 05/04/2017   Anxiety and depression 04/07/2017   Moderate episode of recurrent major depressive disorder (HCC) 12/26/2015   Obesity 12/14/2015   Past Medical History:  Diagnosis Date   Anxiety    Depression    Hemorrhoids     Family History  Problem Relation Age of Onset   Hypothyroidism Mother    Hypertension Father    Healthy Sister     Past Surgical History:  Procedure Laterality Date   ADENOIDECTOMY     EYE SURGERY     Social History   Occupational History   Not on file  Tobacco Use   Smoking status: Former    Types: Cigarettes    Passive exposure: Past   Smokeless tobacco: Never  Vaping Use  Vaping status: Never Used  Substance and Sexual Activity   Alcohol use: No   Drug use: Yes    Types: Marijuana    Comment: Per pt DC'd approximately 04/13/17   Sexual activity: Not Currently

## 2022-10-31 ENCOUNTER — Encounter: Payer: Self-pay | Admitting: Sports Medicine

## 2022-11-04 NOTE — Telephone Encounter (Signed)
Lets give him the shoulder conditioning exercises.

## 2022-11-05 ENCOUNTER — Other Ambulatory Visit: Payer: Self-pay | Admitting: Family Medicine

## 2022-11-05 DIAGNOSIS — F988 Other specified behavioral and emotional disorders with onset usually occurring in childhood and adolescence: Secondary | ICD-10-CM

## 2022-11-05 NOTE — Telephone Encounter (Signed)
Patient should have 2 additional future prescriptions for Adderall which were already sent to pharmacy at the time of his last appointment in July

## 2022-12-24 ENCOUNTER — Other Ambulatory Visit: Payer: Self-pay

## 2022-12-24 DIAGNOSIS — E669 Obesity, unspecified: Secondary | ICD-10-CM

## 2022-12-24 DIAGNOSIS — Z0000 Encounter for general adult medical examination without abnormal findings: Secondary | ICD-10-CM

## 2022-12-24 DIAGNOSIS — Z6834 Body mass index (BMI) 34.0-34.9, adult: Secondary | ICD-10-CM

## 2022-12-25 ENCOUNTER — Other Ambulatory Visit: Payer: Medicaid Other

## 2022-12-25 DIAGNOSIS — R5383 Other fatigue: Secondary | ICD-10-CM | POA: Diagnosis not present

## 2022-12-25 DIAGNOSIS — Z0000 Encounter for general adult medical examination without abnormal findings: Secondary | ICD-10-CM

## 2022-12-25 DIAGNOSIS — E669 Obesity, unspecified: Secondary | ICD-10-CM | POA: Diagnosis not present

## 2022-12-25 DIAGNOSIS — Z6834 Body mass index (BMI) 34.0-34.9, adult: Secondary | ICD-10-CM | POA: Diagnosis not present

## 2022-12-25 DIAGNOSIS — R7301 Impaired fasting glucose: Secondary | ICD-10-CM | POA: Diagnosis not present

## 2022-12-26 LAB — CBC WITH DIFFERENTIAL/PLATELET
Basophils Absolute: 0.1 10*3/uL (ref 0.0–0.2)
Basos: 1 %
EOS (ABSOLUTE): 0.3 10*3/uL (ref 0.0–0.4)
Eos: 6 %
Hematocrit: 41.6 % (ref 37.5–51.0)
Hemoglobin: 13.7 g/dL (ref 13.0–17.7)
Immature Grans (Abs): 0 10*3/uL (ref 0.0–0.1)
Immature Granulocytes: 0 %
Lymphocytes Absolute: 1.4 10*3/uL (ref 0.7–3.1)
Lymphs: 27 %
MCH: 29 pg (ref 26.6–33.0)
MCHC: 32.9 g/dL (ref 31.5–35.7)
MCV: 88 fL (ref 79–97)
Monocytes Absolute: 0.4 10*3/uL (ref 0.1–0.9)
Monocytes: 7 %
Neutrophils Absolute: 3 10*3/uL (ref 1.4–7.0)
Neutrophils: 59 %
Platelets: 174 10*3/uL (ref 150–450)
RBC: 4.72 x10E6/uL (ref 4.14–5.80)
RDW: 13.1 % (ref 11.6–15.4)
WBC: 5.1 10*3/uL (ref 3.4–10.8)

## 2022-12-26 LAB — COMPREHENSIVE METABOLIC PANEL
ALT: 22 [IU]/L (ref 0–44)
AST: 19 [IU]/L (ref 0–40)
Albumin: 4.6 g/dL (ref 4.1–5.1)
Alkaline Phosphatase: 38 [IU]/L — ABNORMAL LOW (ref 44–121)
BUN/Creatinine Ratio: 13 (ref 9–20)
BUN: 16 mg/dL (ref 6–20)
Bilirubin Total: 0.7 mg/dL (ref 0.0–1.2)
CO2: 21 mmol/L (ref 20–29)
Calcium: 9.2 mg/dL (ref 8.7–10.2)
Chloride: 104 mmol/L (ref 96–106)
Creatinine, Ser: 1.26 mg/dL (ref 0.76–1.27)
Globulin, Total: 2 g/dL (ref 1.5–4.5)
Glucose: 83 mg/dL (ref 70–99)
Potassium: 4.2 mmol/L (ref 3.5–5.2)
Sodium: 142 mmol/L (ref 134–144)
Total Protein: 6.6 g/dL (ref 6.0–8.5)
eGFR: 77 mL/min/{1.73_m2} (ref >59)

## 2022-12-26 LAB — LIPID PANEL
Chol/HDL Ratio: 5.9 {ratio} — ABNORMAL HIGH (ref 0.0–5.0)
Cholesterol, Total: 184 mg/dL (ref 100–199)
HDL: 31 mg/dL — ABNORMAL LOW (ref >39)
LDL Chol Calc (NIH): 130 mg/dL — ABNORMAL HIGH (ref 0–99)
Triglycerides: 128 mg/dL (ref 0–149)
VLDL Cholesterol Cal: 23 mg/dL (ref 5–40)

## 2022-12-26 LAB — TSH: TSH: 1.36 u[IU]/mL (ref 0.450–4.500)

## 2022-12-26 LAB — HEMOGLOBIN A1C
Est. average glucose Bld gHb Est-mCnc: 108 mg/dL
Hgb A1c MFr Bld: 5.4 % (ref 4.8–5.6)

## 2022-12-31 ENCOUNTER — Encounter: Payer: Self-pay | Admitting: Family Medicine

## 2022-12-31 ENCOUNTER — Ambulatory Visit (INDEPENDENT_AMBULATORY_CARE_PROVIDER_SITE_OTHER): Payer: Medicaid Other | Admitting: Family Medicine

## 2022-12-31 ENCOUNTER — Telehealth: Payer: Self-pay

## 2022-12-31 VITALS — BP 130/77 | HR 91 | Resp 18 | Ht 72.0 in | Wt 238.0 lb

## 2022-12-31 DIAGNOSIS — G43909 Migraine, unspecified, not intractable, without status migrainosus: Secondary | ICD-10-CM

## 2022-12-31 DIAGNOSIS — Z0000 Encounter for general adult medical examination without abnormal findings: Secondary | ICD-10-CM

## 2022-12-31 DIAGNOSIS — F988 Other specified behavioral and emotional disorders with onset usually occurring in childhood and adolescence: Secondary | ICD-10-CM | POA: Diagnosis not present

## 2022-12-31 DIAGNOSIS — F339 Major depressive disorder, recurrent, unspecified: Secondary | ICD-10-CM | POA: Diagnosis not present

## 2022-12-31 MED ORDER — AMPHETAMINE-DEXTROAMPHETAMINE 5 MG PO TABS
5.0000 mg | ORAL_TABLET | Freq: Every day | ORAL | 0 refills | Status: DC
Start: 2022-12-31 — End: 2023-04-02

## 2022-12-31 MED ORDER — SUMATRIPTAN SUCCINATE 50 MG PO TABS
50.0000 mg | ORAL_TABLET | ORAL | 2 refills | Status: AC | PRN
Start: 2022-12-31 — End: ?

## 2022-12-31 MED ORDER — ELETRIPTAN HYDROBROMIDE 20 MG PO TABS: 20.0000 mg | ORAL_TABLET | ORAL | 0 refills | Status: DC | PRN

## 2022-12-31 MED ORDER — AMPHETAMINE-DEXTROAMPHETAMINE 20 MG PO TABS
20.0000 mg | ORAL_TABLET | ORAL | 0 refills | Status: DC
Start: 2022-12-31 — End: 2023-03-05

## 2022-12-31 NOTE — Addendum Note (Signed)
Addended by: Saralyn Pilar on: 12/31/2022 04:57 PM   Modules accepted: Orders

## 2022-12-31 NOTE — Assessment & Plan Note (Signed)
Provided recommendations for local therapists as well as the psychology today find a therapist website.

## 2022-12-31 NOTE — Telephone Encounter (Signed)
Meds ordered this encounter  Medications   SUMAtriptan (IMITREX) 50 MG tablet    Sig: Take 1 tablet (50 mg total) by mouth every 2 (two) hours as needed for migraine. May repeat in 2 hours if headache persists or recurs.    Dispense:  10 tablet    Refill:  2    Order Specific Question:   Supervising Provider    Answer:   Nani Gasser D [[id number]]

## 2022-12-31 NOTE — Patient Instructions (Signed)
Start taking an over the counter vitamin D3 2000 unit daily supplement.  If after the first month the afternoon dose of Adderall is keeping you awake at night, we can try an extended release of Adderall in the morning instead to see if that lasts longer in the afternoon/evening.   [employer] [facility] - Product manager - Sport and exercise psychologist for [employer] - All are [facility]  Psychology Today Find a Paramedic website: filter by [employer], appointment type, specialty, etc.

## 2022-12-31 NOTE — Progress Notes (Signed)
Complete physical exam  Patient: Scott Mitchell   DOB: 1990/04/23   32 y.o. Male  MRN: [id number]  Subjective:    Chief Complaint  Patient presents with   Annual Exam    Scott Mitchell is a 32 y.o. male who presents today for a complete physical exam. He reports consuming a general diet.  He has made big improvements in his nutrition and physical activity recently.   He generally feels fairly well. He reports sleeping well. He would like to discuss recommendations for therapy in person which he thinks would be helpful.   Most recent fall risk assessment:    04/21/2022    2:41 PM  Fall Risk   Falls in the past year? 0  Number falls in past yr: 0  Injury with Fall? 0  Follow up Falls evaluation completed     Most recent depression and anxiety screenings:    12/31/2022    9:58 AM 09/29/2022    4:47 PM  PHQ 2/9 Scores  PHQ - 2 Score 1 2  PHQ- 9 Score 7 9      12/31/2022    9:58 AM 09/29/2022    4:47 PM 06/19/2022    2:01 PM 04/21/2022    2:41 PM  GAD 7 : Generalized Anxiety Score  Nervous, Anxious, on Edge 1 1 1 1   Control/stop worrying 1 1 1 1   Worry too much - different things 3 2 1 1   Trouble relaxing  2 1 2   Restless 1 1 0 1  Easily annoyed or irritable 1 1 0 1  Afraid - awful might happen 1 1 0 0  Total GAD 7 Score  9 4 7   Anxiety Difficulty Somewhat difficult Somewhat difficult      Patient Active Problem List   Diagnosis Date Noted   Attention deficit disorder (ADD) without hyperactivity 05/18/2022   Esophageal dysphagia 07/02/2021   Depression, recurrent (HCC) 04/13/2018   Acute migraine 05/04/2017   Obesity 12/14/2015    Past Surgical History:  Procedure Laterality Date   ADENOIDECTOMY     EYE SURGERY     Social History   Tobacco Use   Smoking status: Former    Types: Cigarettes    Passive exposure: Past   Smokeless tobacco: Never  Vaping Use   Vaping status: Never Used  Substance Use Topics   Alcohol use: No   Drug use: Yes     Types: Marijuana    Comment: Per pt DC'd approximately 04/13/17   Family History  Problem Relation Age of Onset   Hypothyroidism Mother    Hypertension Father    Healthy Sister    No Known Allergies   Patient Care Team: Melida Quitter, PA as PCP - General (Family Medicine)   Outpatient Medications Prior to Visit  Medication Sig   [DISCONTINUED] amphetamine-dextroamphetamine (ADDERALL) 10 MG tablet Take 1 tablet (10 mg total) by mouth 2 (two) times daily as needed.   [DISCONTINUED] eletriptan (RELPAX) 20 MG tablet Take 1 tablet (20 mg total) by mouth as needed for migraine or headache. May repeat in 2 hours if headache persists or recurs.   [DISCONTINUED] amphetamine-dextroamphetamine (ADDERALL) 10 MG tablet Take 1 tablet (10 mg total) by mouth 2 (two) times daily as needed.   [DISCONTINUED] amphetamine-dextroamphetamine (ADDERALL) 10 MG tablet Take 1 tablet (10 mg total) by mouth 2 (two) times daily as needed.   No facility-administered medications prior to visit.    Review of Systems  Constitutional:  Negative for  chills, fever and malaise/fatigue.  HENT:  Negative for congestion and hearing loss.   Eyes:  Negative for blurred vision and double vision.  Respiratory:  Negative for cough and shortness of breath.   Cardiovascular:  Negative for chest pain, palpitations and leg swelling.  Gastrointestinal:  Negative for abdominal pain, constipation, diarrhea and heartburn.  Genitourinary:  Negative for frequency and urgency.  Musculoskeletal:  Negative for myalgias and neck pain.  Neurological:  Negative for headaches.  Endo/Heme/Allergies:  Negative for polydipsia.  Psychiatric/Behavioral:  Negative for depression. The patient is not nervous/anxious and does not have insomnia.       Objective:    BP 130/77 (BP Location: Left Arm, Patient Position: Sitting, Cuff Size: Normal)   Pulse 91   Resp 18   Ht 6' (1.829 m)   Wt 238 lb (108 kg)   SpO2 98%   BMI 32.28 kg/m     Physical Exam Constitutional:      General: He is not in acute distress.    Appearance: Normal appearance.  HENT:     Head: Normocephalic and atraumatic.     Right Ear: Tympanic membrane, ear canal and external ear normal. There is no impacted cerumen.     Left Ear: Tympanic membrane, ear canal and external ear normal. There is no impacted cerumen.     Nose: Nose normal. No congestion or rhinorrhea.     Mouth/Throat:     Mouth: Mucous membranes are moist.     Pharynx: Oropharynx is clear. No posterior oropharyngeal erythema.  Eyes:     Extraocular Movements: Extraocular movements intact.     Conjunctiva/sclera: Conjunctivae normal.     Pupils: Pupils are equal, round, and reactive to light.  Neck:     Thyroid: No thyroid mass, thyromegaly or thyroid tenderness.  Cardiovascular:     Rate and Rhythm: Normal rate and regular rhythm.     Heart sounds: Normal heart sounds. No murmur heard.    No friction rub. No gallop.  Pulmonary:     Effort: Pulmonary effort is normal. No respiratory distress.     Breath sounds: Normal breath sounds. No stridor. No wheezing or rales.  Abdominal:     General: Bowel sounds are normal.     Palpations: Abdomen is soft. There is no mass.     Tenderness: There is no abdominal tenderness.  Musculoskeletal:        General: Normal range of motion.     Cervical back: Normal range of motion and neck supple.  Lymphadenopathy:     Cervical: No cervical adenopathy.  Skin:    General: Skin is warm and dry.  Neurological:     Mental Status: He is alert and oriented to person, place, and time.     Cranial Nerves: No cranial nerve deficit.     Motor: No weakness.     Deep Tendon Reflexes: Reflexes normal.  Psychiatric:        Mood and Affect: Mood normal.       Assessment & Plan:    Routine Health Maintenance and Physical Exam  Immunization History  Administered Date(s) Administered   PFIZER(Purple Top)SARS-COV-2 Vaccination 05/31/2019, 06/28/2019    Tdap 07/07/2017    Health Maintenance  Topic Date Due   Hepatitis C Screening  Never done   COVID-19 Vaccine (3 - 2023-24 season) 01/16/2023 (Originally 11/16/2022)   INFLUENZA VACCINE  06/15/2023 (Originally 10/16/2022)   DTaP/Tdap/Td (2 - Td or Tdap) 07/08/2027   HIV Screening  Completed  HPV VACCINES  Aged Out    Reviewed most recent labs including CBC, CMP, lipid panel, A1C, TSH. All within normal limits/stable from last check other than LDL elevated at 130 but improved from 138.  Discussed health benefits of physical activity, and encouraged him to engage in regular exercise appropriate for his age and condition.  Wellness examination  Acute migraine Assessment & Plan: Stable.  Insurance preference for sumatriptan, continue sumatriptan 50 mg for abortive therapy.  Will continue to monitor.   Attention deficit disorder (ADD) without hyperactivity Assessment & Plan: Stable.  Continue Adderall 10 mg twice daily.  Sent three 30-day refills to pharmacy.  Will continue to monitor.  Orders: -     Amphetamine-Dextroamphetamine; Take 1 tablet (20 mg total) by mouth every morning.  Dispense: 30 tablet; Refill: 0 -     Amphetamine-Dextroamphetamine; Take 1 tablet (5 mg total) by mouth daily in the afternoon.  Dispense: 30 tablet; Refill: 0  Depression, recurrent (HCC) Assessment & Plan: Provided recommendations for local therapists as well as the psychology today find a therapist website.     Return in about 3 months (around 04/02/2023) for follow-up for ADHD, in person or video.     Melida Quitter, PA

## 2022-12-31 NOTE — Telephone Encounter (Signed)
Eletriptan is not the preferred formulary. Sumatriptan and Rizatriptan are. Please advise.

## 2022-12-31 NOTE — Assessment & Plan Note (Signed)
Stable.  Continue Adderall 10 mg twice daily.  Sent three 30-day refills to pharmacy.  Will continue to monitor.

## 2022-12-31 NOTE — Assessment & Plan Note (Signed)
Stable.  Insurance preference for sumatriptan, continue sumatriptan 50 mg for abortive therapy.  Will continue to monitor.

## 2023-03-05 ENCOUNTER — Other Ambulatory Visit: Payer: Self-pay | Admitting: Family Medicine

## 2023-03-05 DIAGNOSIS — F988 Other specified behavioral and emotional disorders with onset usually occurring in childhood and adolescence: Secondary | ICD-10-CM

## 2023-04-02 ENCOUNTER — Ambulatory Visit (INDEPENDENT_AMBULATORY_CARE_PROVIDER_SITE_OTHER): Payer: Medicaid Other | Admitting: Family Medicine

## 2023-04-02 ENCOUNTER — Encounter: Payer: Self-pay | Admitting: Family Medicine

## 2023-04-02 DIAGNOSIS — F988 Other specified behavioral and emotional disorders with onset usually occurring in childhood and adolescence: Secondary | ICD-10-CM | POA: Diagnosis not present

## 2023-04-02 MED ORDER — AMPHETAMINE-DEXTROAMPHETAMINE 20 MG PO TABS: 20.0000 mg | ORAL_TABLET | ORAL | 0 refills | Status: DC

## 2023-04-02 MED ORDER — AMPHETAMINE-DEXTROAMPHETAMINE 20 MG PO TABS
20.0000 mg | ORAL_TABLET | Freq: Every morning | ORAL | 0 refills | Status: DC
Start: 2023-04-02 — End: 2023-09-23

## 2023-04-02 MED ORDER — AMPHETAMINE-DEXTROAMPHETAMINE 5 MG PO TABS
5.0000 mg | ORAL_TABLET | Freq: Every day | ORAL | 0 refills | Status: DC
Start: 2023-06-01 — End: 2023-09-23

## 2023-04-02 MED ORDER — AMPHETAMINE-DEXTROAMPHETAMINE 5 MG PO TABS
5.0000 mg | ORAL_TABLET | Freq: Every day | ORAL | 0 refills | Status: DC
Start: 2023-05-02 — End: 2023-09-07

## 2023-04-02 MED ORDER — AMPHETAMINE-DEXTROAMPHETAMINE 5 MG PO TABS: 5.0000 mg | ORAL_TABLET | Freq: Every day | ORAL | 0 refills | Status: DC

## 2023-04-02 NOTE — Assessment & Plan Note (Addendum)
Stable.  Continue Adderall 10 mg twice daily.  Controlled substances contract updated today.  PDMP reviewed, no aberrancies.  Overdose risk score 340.  Sent three 30-day refills to pharmacy.  Will continue to monitor.

## 2023-04-02 NOTE — Progress Notes (Signed)
   Established Patient Office Visit  Subjective   Patient ID: Scott Mitchell, male    DOB: 1990/03/28  Age: 33 y.o. MRN: [id number]  Chief Complaint  Patient presents with   ADHD    HPI Scott Mitchell is a 33 y.o. male presenting today for follow up of ADHD. Reports symptoms are well controlled on Adderall 20 mg daily each morning and Adderall 5 mg daily each afternoon. Denies any problems with insomnia, chest pain, palpitations, or SOB.     Outpatient Medications Prior to Visit  Medication Sig   SUMAtriptan (IMITREX) 50 MG tablet Take 1 tablet (50 mg total) by mouth every 2 (two) hours as needed for migraine. May repeat in 2 hours if headache persists or recurs.   [DISCONTINUED] amphetamine-dextroamphetamine (ADDERALL) 20 MG tablet TAKE 1 TABLET (20 MG TOTAL) BY MOUTH EVERY MORNING.   [DISCONTINUED] amphetamine-dextroamphetamine (ADDERALL) 5 MG tablet Take 1 tablet (5 mg total) by mouth daily in the afternoon.   No facility-administered medications prior to visit.    ROS Negative unless otherwise noted in HPI   Objective:     BP 125/79   Pulse 76   Ht 6' (1.829 m)   Wt 226 lb 4 oz (102.6 kg)   SpO2 98%   BMI 30.69 kg/m   Physical Exam Constitutional:      General: He is not in acute distress.    Appearance: Normal appearance.  HENT:     Head: Normocephalic and atraumatic.  Cardiovascular:     Rate and Rhythm: Normal rate and regular rhythm.     Heart sounds: Normal heart sounds. No murmur heard.    No friction rub. No gallop.  Pulmonary:     Effort: Pulmonary effort is normal. No respiratory distress.     Breath sounds: Normal breath sounds. No wheezing, rhonchi or rales.  Skin:    General: Skin is warm and dry.  Neurological:     Mental Status: He is alert and oriented to person, place, and time.  Psychiatric:        Mood and Affect: Mood normal.      Assessment & Plan:  Attention deficit disorder (ADD) without hyperactivity Assessment &  Plan: Stable.  Continue Adderall 10 mg twice daily.  Controlled substances contract updated today.  PDMP reviewed, no aberrancies.  Overdose risk score 340.  Sent three 30-day refills to pharmacy.  Will continue to monitor.  Orders: -     Amphetamine-Dextroamphetamine; Take 1 tablet (20 mg total) by mouth every morning.  Dispense: 30 tablet; Refill: 0 -     Amphetamine-Dextroamphetamine; Take 1 tablet (5 mg total) by mouth daily in the afternoon.  Dispense: 30 tablet; Refill: 0 -     Amphetamine-Dextroamphetamine; Take 1 tablet (20 mg total) by mouth every morning.  Dispense: 30 tablet; Refill: 0 -     Amphetamine-Dextroamphetamine; Take 1 tablet (20 mg total) by mouth in the morning.  Dispense: 30 tablet; Refill: 0 -     Amphetamine-Dextroamphetamine; Take 1 tablet (5 mg total) by mouth daily in the afternoon.  Dispense: 30 tablet; Refill: 0 -     Amphetamine-Dextroamphetamine; Take 1 tablet (5 mg total) by mouth daily in the afternoon.  Dispense: 30 tablet; Refill: 0    Return in about 3 months (around 07/01/2023) for follow-up for ADHD, in person or video.    Melida Quitter, PA

## 2023-04-23 ENCOUNTER — Encounter: Payer: Self-pay | Admitting: Family Medicine

## 2023-06-03 ENCOUNTER — Telehealth: Payer: Self-pay | Admitting: *Deleted

## 2023-06-03 DIAGNOSIS — K200 Eosinophilic esophagitis: Secondary | ICD-10-CM

## 2023-06-03 NOTE — Telephone Encounter (Signed)
 Copied from CRM [id number]. Topic: Appointments - Scheduling Inquiry for Clinic >> Jun 03, 2023 12:17 PM Fuller Mandril wrote: Reason for CRM: Patient called. Wanted to make appt for referrals. States he was just seen at ED last night and had to have emergency endoscopy. No appt available right now until may but patient needs referrals to [facility] and Allergist. Patient stated he is currently in [place] until next week. Thinks the ED was at [facility] in [facility]. Thank You

## 2023-06-04 NOTE — Telephone Encounter (Signed)
 Pt informed of below.

## 2023-06-04 NOTE — Addendum Note (Signed)
 Addended by: Sandre Kitty on: 06/04/2023 06:44 AM   Modules accepted: Orders

## 2023-06-04 NOTE — Telephone Encounter (Signed)
 I looked at his notes and have sent in the referrals.  Gastroenterology referral was placed as urgent because it looks like they wanted to see him back within 4 weeks.

## 2023-06-05 ENCOUNTER — Encounter: Payer: Self-pay | Admitting: Gastroenterology

## 2023-07-10 ENCOUNTER — Ambulatory Visit: Payer: Medicaid Other | Admitting: Family Medicine

## 2023-07-29 ENCOUNTER — Ambulatory Visit (INDEPENDENT_AMBULATORY_CARE_PROVIDER_SITE_OTHER): Admitting: Gastroenterology

## 2023-07-29 ENCOUNTER — Encounter: Payer: Self-pay | Admitting: Gastroenterology

## 2023-07-29 VITALS — BP 130/84 | HR 92 | Ht 72.0 in | Wt 239.0 lb

## 2023-07-29 DIAGNOSIS — R1319 Other dysphagia: Secondary | ICD-10-CM

## 2023-07-29 DIAGNOSIS — K200 Eosinophilic esophagitis: Secondary | ICD-10-CM | POA: Diagnosis not present

## 2023-07-29 DIAGNOSIS — R1310 Dysphagia, unspecified: Secondary | ICD-10-CM

## 2023-07-29 NOTE — Progress Notes (Signed)
 Chief Complaint:eosinophilic esophagitis  Primary GI Doctor: Dr. Karene Oto   HPI:  Patient is a  33  year old male patient with past medical history of anxiety,depression, and ADD,who was referred to me by Laneta Pintos, MD on 06/04/23 for a complaint of eosinophilic esophagitis.    06/02/23 Patient recently seen in the emergency department in [place]  and had food bolus (steak) removed from esophagus. There was concern for eosinophilic esophagitis and biopsy was taken. He was recommended to follow-up with gastroenterologist in approximately 4 weeks. Records are available in care everywhere.   Interval History    Patient presents for follow-up after recent ED visit for dysphagia. Biopsies revealed EOE. Recommendations to follow-up for EGD with possible dilatation. Patient was placed on Pantoprazole 40 mg twice daily about two months and states he has not had any dysphagia since starting medication. He also states he has been eating very carefully. He does have mild nausea with the evening dose of Pantoprazole 40 mg po daily, but tolerable. He states he has occasional pyrosis if he eats certain foods.He also has referral to allergist and seeing next week. No known food allergies. No history of asthma.Family history includes maternal grandfather with history of required dilatations.  Weight stable. Appetite good.   No alcohol use. Nonsmoker.   Past surgical history: adenoidectomy   Wt Readings from Last 3 Encounters:  07/29/23 239 lb (108.4 kg)  04/02/23 226 lb 4 oz (102.6 kg)  12/31/22 238 lb (108 kg)    Past Medical History:  Diagnosis Date   ADHD    Anxiety    Cervical pain (neck) 05/04/2017   Depression    Hemorrhoids    Past Surgical History:  Procedure Laterality Date   ADENOIDECTOMY     EYE SURGERY     Current Outpatient Medications  Medication Sig Dispense Refill   amphetamine -dextroamphetamine  (ADDERALL) 20 MG tablet Take 1 tablet (20 mg total) by mouth in the  morning. 30 tablet 0   amphetamine -dextroamphetamine  (ADDERALL) 5 MG tablet Take 1 tablet (5 mg total) by mouth daily in the afternoon. 30 tablet 0   pantoprazole (PROTONIX) 40 MG tablet Take 40 mg by mouth 2 (two) times daily.     SUMAtriptan  (IMITREX ) 50 MG tablet Take 1 tablet (50 mg total) by mouth every 2 (two) hours as needed for migraine. Valincia Touch repeat in 2 hours if headache persists or recurs. 10 tablet 2   amphetamine -dextroamphetamine  (ADDERALL) 20 MG tablet Take 1 tablet (20 mg total) by mouth every morning. 30 tablet 0   amphetamine -dextroamphetamine  (ADDERALL) 20 MG tablet Take 1 tablet (20 mg total) by mouth every morning. 30 tablet 0   amphetamine -dextroamphetamine  (ADDERALL) 5 MG tablet Take 1 tablet (5 mg total) by mouth daily in the afternoon. 30 tablet 0   amphetamine -dextroamphetamine  (ADDERALL) 5 MG tablet Take 1 tablet (5 mg total) by mouth daily in the afternoon. 30 tablet 0   No current facility-administered medications for this visit.   Allergies as of 07/29/2023   (No Known Allergies)   Family History  Problem Relation Age of Onset   Hypothyroidism Mother    Hypertension Father    Heart defect Father    Healthy Sister    Colon cancer Neg Hx    Esophageal cancer Neg Hx    Stomach cancer Neg Hx     Review of Systems:    Constitutional: No weight loss, fever, chills, weakness or fatigue HEENT: Eyes: No change in vision  Ears, Nose, Throat:  No change in hearing or congestion Skin: No rash or itching Cardiovascular: No chest pain, chest pressure or palpitations   Respiratory: No SOB or cough Gastrointestinal: See HPI and otherwise negative Genitourinary: No dysuria or change in urinary frequency Neurological: No headache, dizziness or syncope Musculoskeletal: No new muscle or joint pain Hematologic: No bleeding or bruising Psychiatric: No history of depression or anxiety    Physical Exam:  Vital signs: BP 130/84   Pulse 92   Ht 6' (1.829 m)    Wt 239 lb (108.4 kg)   SpO2 97%   BMI 32.41 kg/m   Constitutional: Pleasant male appears to be in NAD, Well developed, Well nourished, alert and cooperative Throat: Oral cavity and pharynx without inflammation, swelling or lesion.  Respiratory: Respirations even and unlabored. Lungs clear to auscultation bilaterally.   No wheezes, crackles, or rhonchi.  Cardiovascular: Normal S1, S2. Regular rate and rhythm. No peripheral edema, cyanosis or pallor.  Gastrointestinal:  Soft, nondistended, nontender. No rebound or guarding. Normal bowel sounds. No appreciable masses or hepatomegaly. Rectal:  Not performed.  Msk:  Symmetrical without gross deformities. Without edema, no deformity or joint abnormality.  Neurologic:  Alert and  oriented x4;  grossly normal neurologically.  Skin:   Dry and intact without significant lesions or rashes. Psychiatric: Oriented to person, place and time. Demonstrates good judgement and reason without abnormal affect or behaviors.  RELEVANT LABS AND IMAGING: CBC    Latest Ref Rng & Units 12/25/2022    9:01 AM 08/02/2021    8:44 AM 04/07/2017    4:13 PM  CBC  WBC 3.4 - 10.8 x10E3/uL 5.1  6.4  6.3   Hemoglobin 13.0 - 17.7 g/dL 86.5  78.4  69.6   Hematocrit 37.5 - 51.0 % 41.6  45.1  42.9   Platelets 150 - 450 x10E3/uL 174  163  152      CMP     Latest Ref Rng & Units 12/25/2022    9:01 AM 08/02/2021    8:44 AM 04/13/2018    2:34 PM  CMP  Glucose 70 - 99 mg/dL 83  86  83   BUN 6 - 20 mg/dL 16  19  14    Creatinine 0.76 - 1.27 mg/dL 2.95  2.84  1.32   Sodium 134 - 144 mmol/L 142  140  139   Potassium 3.5 - 5.2 mmol/L 4.2  3.9  4.3   Chloride 96 - 106 mmol/L 104  100  99   CO2 20 - 29 mmol/L 21  24  23    Calcium 8.7 - 10.2 mg/dL 9.2  9.7  9.5   Total Protein 6.0 - 8.5 g/dL 6.6  7.3  7.3   Total Bilirubin 0.0 - 1.2 mg/dL 0.7  0.7  0.8   Alkaline Phos 44 - 121 IU/L 38  40  37   AST 0 - 40 IU/L 19  18  32   ALT 0 - 44 IU/L 22  32  34      Lab Results   Component Value Date   TSH 1.360 12/25/2022   06/02/23 chest xray  1. No acute cardiopulmonary findings. 2. Suboptimally evaluated esophagus. Consider endoscopy or fluoroscopic or CT esophagram with contrast.  XR CHEST PA AND LATERAL performed on 06/02/2023  FINDINGS:  * Normal cardiomediastinal contours. * No pleural effusion or pneumothorax. * No focal consolidation or pulmonary edema. * No acute displaced fracture.  06/02/23 EGD with Dr. Kym Phoenix at  [place] Impression:     - Food in the lower third of the esophagus. Removal was successful.     - Esophageal mucosal changes suggestive of eosinophilic esophagitis.     Biopsied.     - A small amount of food (residue) in the stomach.     - Normal first portion of the duodenum and second portion of the     duodenum.  Path:  ESOPHAGUS, BIOPSY:  ACANTHOTIC SQUAMOUS MUCOSA WITH INCREASED INTRAEPITHELIAL EOSINOPHILS CONSISTENT WITH EOSINOPHILIC ESOPHAGITIS.  Assessment: Encounter Diagnoses  Name Primary?   Eosinophilic esophagitis Yes   Esophageal dysphagia      33 year old male patient that presents for evaluation of recently diagnosed EOE on endoscopy exam when patient was on a family trip in [place]  and had steak lodged in the back of his throat.  Patient states he has had intermittent dysphagia for the past 5 years patient was started on pantoprazole 40 mg twice daily and recommended to follow-up with GI for EGD with dilatation.  Patient reports today he has been feeling much better with PPI therapy in addition to dietary modifications.  We discussed alternative therapies for EOE such as Dupixent.  Patient is scheduled to see allergist next week for allergy testing.  We will plan for follow-up endoscopy within the next month to evaluate inflammation as well as potentially dilate. Plan: -Repeat upper endoscopy with possible dilatation in [facility] with Dr. Karene Oto. The risks and benefits of EGD with possible biopsies and esophageal  dilation were discussed with the patient who agrees to proceed.  - Continue Pantoprazole BID until EGD results, Isador Castille consider once daily. Patient prefers going off medication. -see allergist as scheduled next week. -We discussed alternative therapies such as Dupixent, patient would like to think about it and consult with me following EGD.   Thank you for the courtesy of this consult. Please call me with any questions or concerns.   Kassim Guertin, FNP-C [facility] 07/29/2023, 9:02 AM  Cc: Laneta Pintos, MD

## 2023-07-29 NOTE — Patient Instructions (Addendum)
 Your recent biopsies are suggestive of a condition called Eosinophilic Esophagitis. Although increasing in incidence, the cause of this condition is not entirely clear.  The current belief is that it may be at least partially allergic in nature, though identifying specific environmental or food allergens is often difficult.  The diagnosis and management can also be challenging because many patients have coexisting GERD, which can cause similar symptoms as well as endoscopic and biopsy findings.  Here are some websites with information that I hope you will find useful.  [email or web address]://[email or web address]://www.[email or web address]  http://adkins.net/  https://www.aguirre.[email or web address]  You have been scheduled for an endoscopy. Please follow written instructions given to you at your visit today.  If you use inhalers (even only as needed), please bring them with you on the day of your procedure.  If you take any of the following medications, they will need to be adjusted prior to your procedure:   DO NOT TAKE 7 DAYS PRIOR TO TEST- Trulicity (dulaglutide) Ozempic, Wegovy (semaglutide) Mounjaro (tirzepatide) Bydureon Bcise (exanatide extended release)  DO NOT TAKE 1 DAY PRIOR TO YOUR TEST Rybelsus (semaglutide) Adlyxin (lixisenatide) Victoza (liraglutide) Byetta (exanatide) ___________________________________________________________________________  Due to recent changes in healthcare laws, you may see the results of your imaging and laboratory studies on MyChart before your provider has had a chance to review them.  We understand that in some cases there may be results that are confusing or concerning to you. Not all laboratory results come back in the same time frame and the provider may be waiting for multiple results in order to interpret others.  Please give us  48 hours in order for your provider to  thoroughly review all the results before contacting the office for clarification of your results.  _______________________________________________________  If your blood pressure at your visit was 140/90 or greater, please contact your primary care physician to follow up on this.  _______________________________________________________  If you are age 5 or older, your body mass index should be between 23-30. Your Body mass index is 32.41 kg/m. If this is out of the aforementioned range listed, please consider follow up with your Primary Care Provider.  If you are age 51 or younger, your body mass index should be between 19-25. Your Body mass index is 32.41 kg/m. If this is out of the aformentioned range listed, please consider follow up with your Primary Care Provider.   ________________________________________________________  The [facility] providers would like to encourage you to use MYCHART to communicate with providers for non-urgent requests or questions.  Due to long hold times on the telephone, sending your provider a message by [facility] may be a faster and more efficient way to get a response.  Please allow 48 business hours for a response.  Please remember that this is for non-urgent requests.  _______________________________________________________ Thank you for trusting me with your gastrointestinal care. Deanna May, RNP

## 2023-08-06 NOTE — Progress Notes (Signed)
 Agree with the assessment and plan as outlined by [facility], FNP-C.  Scott Bottino, DO, [facility]

## 2023-08-07 ENCOUNTER — Ambulatory Visit (INDEPENDENT_AMBULATORY_CARE_PROVIDER_SITE_OTHER): Admitting: Allergy

## 2023-08-07 ENCOUNTER — Other Ambulatory Visit: Payer: Self-pay

## 2023-08-07 ENCOUNTER — Encounter: Payer: Self-pay | Admitting: Allergy

## 2023-08-07 VITALS — BP 120/90 | HR 92 | Temp 98.1°F | Resp 16 | Ht 71.26 in | Wt 238.6 lb

## 2023-08-07 DIAGNOSIS — J310 Chronic rhinitis: Secondary | ICD-10-CM

## 2023-08-07 DIAGNOSIS — K200 Eosinophilic esophagitis: Secondary | ICD-10-CM

## 2023-08-07 MED ORDER — FLUTICASONE PROPIONATE HFA 220 MCG/ACT IN AERO: INHALATION_SPRAY | RESPIRATORY_TRACT | 5 refills | Status: DC

## 2023-08-07 NOTE — Patient Instructions (Addendum)
 Eosinophilic esophagitis (EOE) Scott Mitchell has concomitant seasonal allergic rhinitis.  Food allergen skin prick tests were negative today despite a positive histamine control. The negative predictive value for food allergen skin testing is excellent, with the exception of milk. However, false negatives may occur, particularly with milk. Therefore if symptoms persist or progress, a elimination diet (milk, soy, eggs, wheat, peanuts/tree nuts, and seafood), or at least a trial elimination of milk, is a reasonable consideration. Schedule skin testing for environmental and select food allergens.  Avoid antihistamines for 3 days prior to testing. Continue pantoprazole as directed. After your upcoming scope recommend starting swallowed Flovent 220mcg 2 puffs swallowed twice a day.  Do not eat or drink for 30 minutes after taking this medication We will plan to start Dupixent injections for EOE management.  This is done once a week and can be self-administered Follow up with your gastroenterologist as scheduled for monitoring.  Schedule skin testing visit Routine follow-up in 3 to 4 months

## 2023-08-07 NOTE — Progress Notes (Signed)
 New Patient Note  RE: Scott Mitchell MRN: [id number] DOB: [birth year] Date of Office Visit: 08/07/2023  Primary care provider: Noreene Bearded, PA  Chief Complaint: EOE  History of present illness: Scott Mitchell is a 33 y.o. male presenting today for evaluation of EOE.  Discussed the use of AI scribe software for clinical note transcription with the patient, who gave verbal consent to proceed.  He has been experiencing issues with eating and swallowing for approximately five to six years.  The one food that he has noted that usually will cause him to have swallowing difficulties is rice. These episodes have been frequent, though not occurring every time he eats. The first severe episode occurred last year, and more recently, two to three months ago, he had a significant incident where steak became lodged in his esophagus, requiring emergency intervention in [place]  for disimpaction. A biopsy performed during the emergency visit confirmed the diagnosis of eosinophilic esophagitis.  From the endoscopy report the esophageal mucosal changes suggestive of eosinophilic esophagitis.  Biopsy report from 06/02/2023 showed: ESOPHAGUS, BIOPSY:                  ACANTHOTIC SQUAMOUS MUCOSA WITH INCREASED INTRAEPITHELIAL EOSINOPHILS CONSISTENT WITH EOSINOPHILIC ESOPHAGITIS.  He is currently taking pantoprazole as part of his treatment regimen.  He is scheduled in about 2 weeks for a repeat endoscopy.  He is interested in learning more about Dupixent for EOE management.  His mother has alpha-gal syndrome, which has influenced dietary restrictions within his family.  He experiences seasonal allergies like congestion, drainage, sneezing and occasionally takes over-the-counter medications like Benadryl when symptoms are severe.      Review of systems: 10pt ROS negative unless noted above in HPI  Past medical history: Past Medical History:  Diagnosis Date   ADHD    Anxiety     Cervical pain (neck) 05/04/2017   Depression    Hemorrhoids     Past surgical history: Past Surgical History:  Procedure Laterality Date   ADENOIDECTOMY     EYE SURGERY     TYMPANOSTOMY TUBE PLACEMENT      Family history:  Family History  Problem Relation Age of Onset   Hypothyroidism Mother    Hypertension Father    Heart defect Father    Healthy Sister    Asthma Maternal Grandfather    Colon cancer Neg Hx    Esophageal cancer Neg Hx    Stomach cancer Neg Hx     Social history: Lives in a home without carpeting with electric heating and central cooling.  Dogs in the home.  There is no concern for water damage, mildew or roaches in the home.  He is unemployed.  He denies a smoking history.   Medication List: Current Outpatient Medications  Medication Sig Dispense Refill   amphetamine -dextroamphetamine  (ADDERALL) 20 MG tablet Take 1 tablet (20 mg total) by mouth in the morning. 30 tablet 0   amphetamine -dextroamphetamine  (ADDERALL) 5 MG tablet Take 1 tablet (5 mg total) by mouth daily in the afternoon. 30 tablet 0   pantoprazole (PROTONIX) 40 MG tablet Take 40 mg by mouth 2 (two) times daily.     SUMAtriptan  (IMITREX ) 50 MG tablet Take 1 tablet (50 mg total) by mouth every 2 (two) hours as needed for migraine. May repeat in 2 hours if headache persists or recurs. 10 tablet 2   amphetamine -dextroamphetamine  (ADDERALL) 20 MG tablet Take 1 tablet (20 mg total) by mouth every morning. 30 tablet 0  amphetamine -dextroamphetamine  (ADDERALL) 20 MG tablet Take 1 tablet (20 mg total) by mouth every morning. 30 tablet 0   amphetamine -dextroamphetamine  (ADDERALL) 5 MG tablet Take 1 tablet (5 mg total) by mouth daily in the afternoon. 30 tablet 0   amphetamine -dextroamphetamine  (ADDERALL) 5 MG tablet Take 1 tablet (5 mg total) by mouth daily in the afternoon. 30 tablet 0   No current facility-administered medications for this visit.    Known medication allergies: No Known  Allergies   Physical examination: Blood pressure (!) 120/90, pulse 92, temperature 98.1 F (36.7 C), resp. rate 16, height 5' 11.26" (1.81 m), weight 238 lb 9.6 oz (108.2 kg), SpO2 99%.  General: Alert, interactive, in no acute distress. HEENT: PERRLA, TMs pearly gray, turbinates non-edematous without discharge, post-pharynx non erythematous. Neck: Supple without lymphadenopathy. Lungs: Clear to auscultation without wheezing, rhonchi or rales. {no increased work of breathing. CV: Normal S1, S2 without murmurs. Abdomen: Nondistended, nontender. Skin: Warm and dry, without lesions or rashes. Extremities:  No clubbing, cyanosis or edema. Neuro:   Grossly intact.  Diagnositics/Labs: See HPI   Assessment and plan:   Eosinophilic esophagitis (EOE) Scott Mitchell has concomitant seasonal allergic rhinitis.  Food allergen skin prick tests were negative today despite a positive histamine control. The negative predictive value for food allergen skin testing is excellent, with the exception of milk. However, false negatives may occur, particularly with milk. Therefore if symptoms persist or progress, a elimination diet (milk, soy, eggs, wheat, peanuts/tree nuts, and seafood), or at least a trial elimination of milk, is a reasonable consideration. Schedule skin testing for environmental and select food allergens.  Avoid antihistamines for 3 days prior to testing. Continue pantoprazole as directed. After your upcoming scope recommend starting swallowed Flovent 220mcg 2 puffs swallowed twice a day.  Do not eat or drink for 30 minutes after taking this medication We will plan to start Dupixent injections for EOE management.  This is done once a week and can be self-administered.  Benefits, risk and protocol discussed regarding Dupixent today.  Informational brochure provided. Follow up with your gastroenterologist as scheduled for monitoring.  Schedule skin testing visit Routine follow-up in 3 to 4  months  I appreciate the opportunity to take part in Callie's care. Please do not hesitate to contact me with questions.  Sincerely,   Catha Clink, MD Allergy/Immunology [facility]

## 2023-08-14 ENCOUNTER — Ambulatory Visit: Admitting: Allergy

## 2023-08-21 ENCOUNTER — Ambulatory Visit: Admitting: Allergy

## 2023-08-21 ENCOUNTER — Encounter: Payer: Self-pay | Admitting: Allergy

## 2023-08-21 DIAGNOSIS — K200 Eosinophilic esophagitis: Secondary | ICD-10-CM

## 2023-08-21 DIAGNOSIS — J3089 Other allergic rhinitis: Secondary | ICD-10-CM

## 2023-08-21 DIAGNOSIS — J302 Other seasonal allergic rhinitis: Secondary | ICD-10-CM | POA: Diagnosis not present

## 2023-08-21 NOTE — Progress Notes (Signed)
 Follow-up Note  RE: Scott Mitchell MRN: [id number] DOB: 06-Dec-1990 Date of Office Visit: 08/21/2023   History of present illness: Scott Mitchell is a 33 y.o. male presenting today for skin testing visit.  He was last seen in the office on 08/07/23 for EOE.  He is in her usual state of health today without recent illness.  He has held antihistamines for at least 3 days for testing today.   Medication List: Current Outpatient Medications  Medication Sig Dispense Refill   amphetamine -dextroamphetamine  (ADDERALL) 20 MG tablet Take 1 tablet (20 mg total) by mouth every morning. 30 tablet 0   amphetamine -dextroamphetamine  (ADDERALL) 20 MG tablet Take 1 tablet (20 mg total) by mouth every morning. 30 tablet 0   amphetamine -dextroamphetamine  (ADDERALL) 20 MG tablet Take 1 tablet (20 mg total) by mouth in the morning. 30 tablet 0   amphetamine -dextroamphetamine  (ADDERALL) 5 MG tablet Take 1 tablet (5 mg total) by mouth daily in the afternoon. 30 tablet 0   amphetamine -dextroamphetamine  (ADDERALL) 5 MG tablet Take 1 tablet (5 mg total) by mouth daily in the afternoon. 30 tablet 0   amphetamine -dextroamphetamine  (ADDERALL) 5 MG tablet Take 1 tablet (5 mg total) by mouth daily in the afternoon. 30 tablet 0   fluticasone  (FLOVENT  HFA) 220 MCG/ACT inhaler 2 puffs swallowed twice a day.  Do not eat or drink for 30 minutes after taking this medication 1 each 5   pantoprazole (PROTONIX) 40 MG tablet Take 40 mg by mouth 2 (two) times daily.     SUMAtriptan  (IMITREX ) 50 MG tablet Take 1 tablet (50 mg total) by mouth every 2 (two) hours as needed for migraine. May repeat in 2 hours if headache persists or recurs. 10 tablet 2   No current facility-administered medications for this visit.     Known medication allergies: No Known Allergies  Diagnostics/Labs:  Allergy testing:   Airborne Adult Perc - 08/21/23 0952     Time Antigen Placed 4098    Allergen Manufacturer Floyd Hutchinson    Location Back     Number of Test 55    1. Control-Buffer 50% Glycerol Negative    2. Control-Histamine 2+    3. Bahia 3+    4. [place] 2+    5. Johnson 3+    6. [place]  Blue 4+    7. Meadow Fescue 4+    8. Perennial Rye 4+    9. Timothy 4+    10. Ragweed Mix 2+    11. Cocklebur 2+    12. Plantain,  English 3+    13. Baccharis Negative    14. Dog Fennel Negative    15. Russian Thistle 3+    16. Lamb's Quarters 2+    17. Sheep Sorrell 2+    18. Rough Pigweed 2+    19. Marsh Elder, Rough 2+    20. Mugwort, Common 3+    21. Box, Elder 4+    22. Cedar, red Negative    23. Sweet Gum 3+    24. Pecan Pollen 3+    25. Pine Mix 2+    26. Walnut, Black Pollen 3+    27. Red Mulberry 3+    28. Ash Mix 3+    29. Birch Mix 3+    30. Beech American 4+    31. Cottonwood, Eastern 4+    32. Hickory, White 4+    33. Maple Mix 3+    34. Oak, [place] Mix 3+    35. Sycamore Eastern 3+  36. Alternaria Alternata 3+    37. Cladosporium Herbarum 2+    38. Aspergillus Mix 2+    39. Penicillium Mix 2+    40. Bipolaris Sorokiniana (Helminthosporium) 3+    41. Drechslera Spicifera (Curvularia) 2+    42. Mucor Plumbeus Negative    43. Fusarium Moniliforme 2+    44. Aureobasidium Pullulans (pullulara) 2+    45. Rhizopus Oryzae Negative    46. Botrytis Cinera Negative    47. Epicoccum Nigrum 2+    48. Phoma Betae 2+    49. Dust Mite Mix 4+    50. Cat Hair 10,000 BAU/ml 2+    51.  Dog Epithelia Negative    52. Mixed Feathers Negative    53. Horse Epithelia Negative    54. Cockroach, German 2+    55. Tobacco Leaf Negative             Food Adult Perc - 08/21/23 0900     Time Antigen Placed 3244    Allergen Manufacturer Floyd Hutchinson    Location Back    Number of allergen test 2     Control-buffer 50% Glycerol Negative    Control-Histamine 2+    29. Rice 2+    36. Beef Negative             Allergy testing results were read and interpreted by provider, documented by clinical staff.   Assessment  and plan: Eosinophilic esophagitis (EOE) Orris has concomitant seasonal allergic rhinitis.   Food skin testing is positive to rice and negative to beef.   Avoid rice in diet.   Environmental allergy skin testing is positive to grasses, trees, weeds, molds, dust mite, cat and cockroach.  Avoidance measures provided.  You can have more EOE symptoms during pollen season with this exposure.   Continue pantoprazole as directed. After your upcoming scope recommend start swallowed Flovent  220mcg 2 puffs swallowed twice a day.  Do not eat or drink for 30 minutes after taking this medication.  This is a standard EOE management medication. We will plan to start Dupixent injections for EOE management.  This is done once a week and can be self-administered.  Let me know after your scope is done and will notify our coordinator, Tammy, who does the approval process for Dupixent as will need the eosinophil count from your upcoming endoscopy to accompany the prescription.    Follow up with your gastroenterologist as scheduled for monitoring.  Routine follow-up in 3 to 4 months  I appreciate the opportunity to take part in Alassane's care. Please do not hesitate to contact me with questions.  Sincerely,   Catha Clink, MD Allergy/Immunology [facility]

## 2023-08-21 NOTE — Patient Instructions (Addendum)
 Eosinophilic esophagitis (EOE) Scott Mitchell has concomitant seasonal allergic rhinitis.     Food skin testing is positive to rice and negative to beef.   Avoid rice in diet.   Environmental allergy skin testing is positive to grasses, trees, weeds, molds, dust mite, cat and cockroach.  Avoidance measures provided.  You can have more EOE symptoms during pollen season with this exposure.   Continue pantoprazole as directed. After your upcoming scope recommend start swallowed Flovent  220mcg 2 puffs swallowed twice a day.  Do not eat or drink for 30 minutes after taking this medication We will plan to start Dupixent injections for EOE management.  This is done once a week and can be self-administered.  Let me know after your scope is done and will notify our coordinator, Tammy, who does the approval process to Dupixent as will need the eosinophil count from your upcoming endoscopy to accompany to the prescription.    Follow up with your gastroenterologist as scheduled for monitoring.  Routine follow-up in 3 to 4 months

## 2023-08-28 ENCOUNTER — Encounter: Payer: Self-pay | Admitting: Gastroenterology

## 2023-08-28 ENCOUNTER — Ambulatory Visit (AMBULATORY_SURGERY_CENTER): Admitting: Gastroenterology

## 2023-08-28 VITALS — BP 112/70 | HR 62 | Temp 98.4°F | Resp 12 | Ht 72.0 in | Wt 239.0 lb

## 2023-08-28 DIAGNOSIS — K200 Eosinophilic esophagitis: Secondary | ICD-10-CM

## 2023-08-28 DIAGNOSIS — F32A Depression, unspecified: Secondary | ICD-10-CM | POA: Diagnosis not present

## 2023-08-28 DIAGNOSIS — R1310 Dysphagia, unspecified: Secondary | ICD-10-CM | POA: Diagnosis not present

## 2023-08-28 DIAGNOSIS — K222 Esophageal obstruction: Secondary | ICD-10-CM

## 2023-08-28 DIAGNOSIS — F419 Anxiety disorder, unspecified: Secondary | ICD-10-CM | POA: Diagnosis not present

## 2023-08-28 DIAGNOSIS — K2289 Other specified disease of esophagus: Secondary | ICD-10-CM | POA: Diagnosis not present

## 2023-08-28 DIAGNOSIS — R1319 Other dysphagia: Secondary | ICD-10-CM

## 2023-08-28 MED ORDER — SODIUM CHLORIDE 0.9 % IV SOLN: 500.0000 mL | Freq: Once | INTRAVENOUS | Status: DC

## 2023-08-28 NOTE — Progress Notes (Signed)
 Called to room to assist during endoscopic procedure.  Patient ID and intended procedure confirmed with present staff. Received instructions for my participation in the procedure from the performing physician.

## 2023-08-28 NOTE — Progress Notes (Signed)
 Pt's states no medical or surgical changes since previsit or office visit.

## 2023-08-28 NOTE — Patient Instructions (Addendum)
 Resume previous diet today.                           - Continue present medications.                           - Await pathology results.                           - Will communicate pathology results to your                            Allergist when they have resulted.     YOU HAD AN ENDOSCOPIC PROCEDURE TODAY AT THE [facility]:   Refer to the procedure report that was given to you for any specific questions about what was found during the examination.  If the procedure report does not answer your questions, please call your gastroenterologist to clarify.  If you requested that your care partner not be given the details of your procedure findings, then the procedure report has been included in a sealed envelope for you to review at your convenience later.  YOU SHOULD EXPECT: Some feelings of bloating in the abdomen. Passage of more gas than usual.  Walking can help get rid of the air that was put into your GI tract during the procedure and reduce the bloating. If you had a lower endoscopy (such as a colonoscopy or flexible sigmoidoscopy) you may notice spotting of blood in your stool or on the toilet paper. If you underwent a bowel prep for your procedure, you may not have a normal bowel movement for a few days.  Please Note:  You might notice some irritation and congestion in your nose or some drainage.  This is from the oxygen used during your procedure.  There is no need for concern and it should clear up in a day or so.  SYMPTOMS TO REPORT IMMEDIATELY:   Following upper endoscopy (EGD)  Vomiting of blood or coffee ground material  New chest pain or pain under the shoulder blades  Painful or persistently difficult swallowing  New shortness of breath  Fever of 100F or higher  Black, tarry-looking stools  For urgent or emergent issues, a gastroenterologist can be reached at any hour by calling [phone]. Do not use MyChart messaging for urgent concerns.    DIET:   We do recommend a small meal at first, but then you may proceed to your regular diet.  Drink plenty of fluids but you should avoid alcoholic beverages for 24 hours.  ACTIVITY:  You should plan to take it easy for the rest of today and you should NOT DRIVE or use heavy machinery until tomorrow (because of the sedation medicines used during the test).    FOLLOW UP: Our staff will call the number listed on your records the next business day following your procedure.  We will call around 7:15- 8:00 am to check on you and address any questions or concerns that you may have regarding the information given to you following your procedure. If we do not reach you, we will leave a message.     If any biopsies were taken you will be contacted by phone or by letter within the next 1-3 weeks.  Please call us  at [phone] if you have not heard about the  biopsies in 3 weeks.    SIGNATURES/CONFIDENTIALITY: You and/or your care partner have signed paperwork which will be entered into your electronic medical record.  These signatures attest to the fact that that the information above on your After Visit Summary has been reviewed and is understood.  Full responsibility of the confidentiality of this discharge information lies with you and/or your care-partner.

## 2023-08-28 NOTE — Progress Notes (Signed)
 Sedate, gd SR, tolerated procedure well, VSS, report to RN

## 2023-08-28 NOTE — Progress Notes (Signed)
 GASTROENTEROLOGY PROCEDURE H&P NOTE   Primary Care Physician: Odilia Bennett, PA-C    Reason for Procedure:  Eosinophilic esophagitis, dysphagia  Plan:    EGD  Patient is appropriate for endoscopic procedure(s) in the ambulatory ([facility]) setting.  The nature of the procedure, as well as the risks, benefits, and alternatives were carefully and thoroughly reviewed with the patient. Ample time for discussion and questions allowed. The patient understood, was satisfied, and agreed to proceed.     HPI: Scott Mitchell is a 33 y.o. male who presents for EGD for evaluation of eosinophilic esophagitis.  He was seen in the emergency department in [place]  on 06/02/2023 for acute esophageal food impaction.  EGD at that time with food retrieval and biopsies showing eosinophilic esophagitis (number of eosinophils per hpf was not given on pathology result).  Treated with high-dose PPI and presents today for reevaluation.  Has also been seen in the Allergy  Clinic with plan to initiate Dupixent depending on repeat EGD/biopsy results.  06/02/23 EGD with Dr. Kym Phoenix at [facility] Impression:     - Food in the lower third of the esophagus. Removal was successful.     - Esophageal mucosal changes suggestive of eosinophilic esophagitis.     Biopsied.     - A small amount of food (residue) in the stomach.     - Normal first portion of the duodenum and second portion of the     duodenum.  Path:  ESOPHAGUS, BIOPSY:  ACANTHOTIC SQUAMOUS MUCOSA WITH INCREASED INTRAEPITHELIAL EOSINOPHILS CONSISTENT WITH EOSINOPHILIC ESOPHAGITIS.  Past Medical History:  Diagnosis Date   ADHD    Anxiety    Cervical pain (neck) 05/04/2017   Depression    Hemorrhoids     Past Surgical History:  Procedure Laterality Date   ADENOIDECTOMY     EYE SURGERY     TYMPANOSTOMY TUBE PLACEMENT      Prior to Admission medications   Medication Sig Start Date End Date Taking? Authorizing Provider   amphetamine -dextroamphetamine  (ADDERALL) 5 MG tablet Take 1 tablet (5 mg total) by mouth daily in the afternoon. 04/02/23  Yes Maryclare Smoke A, PA  pantoprazole (PROTONIX) 40 MG tablet Take 40 mg by mouth 2 (two) times daily. 06/02/23  Yes [provider]  amphetamine -dextroamphetamine  (ADDERALL) 20 MG tablet Take 1 tablet (20 mg total) by mouth every morning. 05/02/23 06/01/23  Noreene Bearded, PA  amphetamine -dextroamphetamine  (ADDERALL) 20 MG tablet Take 1 tablet (20 mg total) by mouth every morning. 06/01/23 07/01/23  Noreene Bearded, PA  amphetamine -dextroamphetamine  (ADDERALL) 20 MG tablet Take 1 tablet (20 mg total) by mouth in the morning. 04/02/23   Noreene Bearded, PA  amphetamine -dextroamphetamine  (ADDERALL) 5 MG tablet Take 1 tablet (5 mg total) by mouth daily in the afternoon. 05/02/23 06/01/23  Noreene Bearded, PA  amphetamine -dextroamphetamine  (ADDERALL) 5 MG tablet Take 1 tablet (5 mg total) by mouth daily in the afternoon. 06/01/23 07/01/23  Noreene Bearded, PA  fluticasone  (FLOVENT  HFA) 220 MCG/ACT inhaler 2 puffs swallowed twice a day.  Do not eat or drink for 30 minutes after taking this medication 08/07/23   Brian Campanile, MD  SUMAtriptan  (IMITREX ) 50 MG tablet Take 1 tablet (50 mg total) by mouth every 2 (two) hours as needed for migraine. May repeat in 2 hours if headache persists or recurs. 12/31/22   Noreene Bearded, PA    Current Outpatient Medications  Medication Sig Dispense Refill   amphetamine -dextroamphetamine  (ADDERALL) 5 MG tablet Take  1 tablet (5 mg total) by mouth daily in the afternoon. 30 tablet 0   pantoprazole (PROTONIX) 40 MG tablet Take 40 mg by mouth 2 (two) times daily.     amphetamine -dextroamphetamine  (ADDERALL) 20 MG tablet Take 1 tablet (20 mg total) by mouth every morning. 30 tablet 0   amphetamine -dextroamphetamine  (ADDERALL) 20 MG tablet Take 1 tablet (20 mg total) by mouth every morning. 30 tablet 0    amphetamine -dextroamphetamine  (ADDERALL) 20 MG tablet Take 1 tablet (20 mg total) by mouth in the morning. 30 tablet 0   amphetamine -dextroamphetamine  (ADDERALL) 5 MG tablet Take 1 tablet (5 mg total) by mouth daily in the afternoon. 30 tablet 0   amphetamine -dextroamphetamine  (ADDERALL) 5 MG tablet Take 1 tablet (5 mg total) by mouth daily in the afternoon. 30 tablet 0   fluticasone  (FLOVENT  HFA) 220 MCG/ACT inhaler 2 puffs swallowed twice a day.  Do not eat or drink for 30 minutes after taking this medication 1 each 5   SUMAtriptan  (IMITREX ) 50 MG tablet Take 1 tablet (50 mg total) by mouth every 2 (two) hours as needed for migraine. May repeat in 2 hours if headache persists or recurs. 10 tablet 2   Current Facility-Administered Medications  Medication Dose Route Frequency Provider Last Rate Last Admin   0.9 %  sodium chloride infusion  500 mL Intravenous Once Lidia Clavijo V, DO        Allergies as of 08/28/2023   (No Known Allergies)    Family History  Problem Relation Age of Onset   Hypothyroidism Mother    Hypertension Father    Heart defect Father    Healthy Sister    Asthma Maternal Grandfather    Colon cancer Neg Hx    Esophageal cancer Neg Hx    Stomach cancer Neg Hx     Social History   Socioeconomic History   Marital status: Single    Spouse name: Not on file   Number of children: 0   Years of education: Not on file   Highest education level: Bachelor's degree (e.g., BA, AB, BS)  Occupational History   Not on file  Tobacco Use   Smoking status: Never    Passive exposure: Past   Smokeless tobacco: Never  Vaping Use   Vaping status: Never Used  Substance and Sexual Activity   Alcohol use: No    Comment: Past use   Drug use: Not Currently    Types: Marijuana    Comment: Per pt DC'd approximately 04/13/17 (occasionally)   Sexual activity: Not Currently  Other Topics Concern   Not on file  Social History Narrative   Not on file   Social Drivers of  Health   Financial Resource Strain: Low Risk  (06/02/2023)   Received from [place]  [facility]   Financial Resource Strain    In the past year, have you or any family members you live with been unable to get any of the following when it was really needed? : No  Food Insecurity: No Food Insecurity (12/31/2022)   Hunger Vital Sign    Worried About Running Out of Food in the Last Year: Never true    Ran Out of Food in the Last Year: Never true  Transportation Needs: Low Risk  (06/02/2023)   Received from [place]  [facility]   Transportation Needs    Has lack of transportation kept you from medical appointments, meetings, work, or from getting things needed for daily living? : No  Physical  Activity: Sufficiently Active (12/31/2022)   Exercise Vital Sign    Days of Exercise per Week: 5 days    Minutes of Exercise per Session: 30 min  Stress: No Stress Concern Present (12/31/2022)   [employer] of Occupational Health - Occupational Stress Questionnaire    Feeling of Stress : Not at all  Social Connections: Medium Risk (06/02/2023)   Received from [place]  [facility]   Social Connections    How often do you see or talk to people that you care about and feel close to? (For example: talking to friends on the phone, visiting friends or family, going to church or club meetings): 3 to 5 times a week  Intimate Partner Violence: Not At Risk (12/31/2022)   Humiliation, Afraid, Rape, and Kick questionnaire    Fear of Current or Ex-Partner: No    Emotionally Abused: No    Physically Abused: No    Sexually Abused: No    Physical Exam: Vital signs in last 24 hours: @BP  109/62   Pulse 67   Temp 98.4 F (36.9 C)   Ht 6' (1.829 m)   Wt 239 lb (108.4 kg)   SpO2 99%   BMI 32.41 kg/m  GEN: NAD EYE: Sclerae anicteric ENT: MMM CV: Non-tachycardic Pulm: CTA b/l GI: Soft, NT/ND NEURO:  Alert & Oriented x 3   Harry Lindau, DO [facility]   08/28/2023 10:40 AM

## 2023-08-28 NOTE — Op Note (Signed)
 [facility] Patient Name: Scott Mitchell Procedure Date: 08/28/2023 10:32 AM MRN: [id number] Endoscopist: Harry Lindau , MD, [id number] Age: 33 Referring MD:  Date of Birth: [birth year] Gender: Male Account #: [id number] Procedure:                Upper GI endoscopy Indications:              Dysphagia, Follow-up of eosinophilic esophagitis                           33 year old male with longstanding history of                            intermittent solid food dysphagia, recently                            diagnosed with eosinophilic esophagitis during                            emergency department evaluation on 3/18/725 while                            in [place] . He had presented to the ER with                            acute esophageal food impaction requiring                            endoscopic retrieval and biopsies showing increased                            esophageal eosinophils consistent with EOE (number                            of eosinophils per hpf not identified on pathology                            result). Has been treated with high-dose Protonix                            with clinical improvement. No recent dysphagia. Has                            also been evaluated in the Allergy  Clinic for                            potentially starting Dupixent. Presents today for                            endoscopic reevaluation. Medicines:                Monitored Anesthesia Care Procedure:                Pre-Anesthesia Assessment:                           -  Prior to the procedure, a History and Physical                            was performed, and patient medications and                            allergies were reviewed. The patient's tolerance of                            previous anesthesia was also reviewed. The risks                            and benefits of the procedure and the sedation                            options and risks  were discussed with the patient.                            All questions were answered, and informed consent                            was obtained. Prior Anticoagulants: The patient has                            taken no anticoagulant or antiplatelet agents. ASA                            Grade Assessment: II - A patient with mild systemic                            disease. After reviewing the risks and benefits,                            the patient was deemed in satisfactory condition to                            undergo the procedure.                           After obtaining informed consent, the endoscope was                            passed under direct vision. Throughout the                            procedure, the patient's blood pressure, pulse, and                            oxygen saturations were monitored continuously. The                            GIF [id number] #[id number] was introduced through the  mouth, and advanced to the second part of duodenum.                            The upper GI endoscopy was accomplished without                            difficulty. The patient tolerated the procedure                            well. Scope In: Scope Out: Findings:                 Mucosal changes including longitudinal furrows were                            found in the entire esophagus. Esophageal findings                            were graded using the Eosinophilic Esophagitis                            Endoscopic Reference Score (EoE-EREFS) as: Edema                            Grade 0 Normal (distinct vascular markings), Rings                            Grade 0 None (no ridges or rings seen), Exudates                            Grade 1 Mild (scattered white lesions involving                            less than 10 percent of the esophageal surface                            area), Furrows Grade 1 Mild (vertical lines without                             visible depth) and Stricture none (no stricture                            found). Biopsies were obtained from the proximal                            and distal esophagus with cold forceps for                            histology of suspected eosinophilic esophagitis.                            Estimated blood loss was minimal. There was a  subtle, nonobstructing stricture in the distal                            esophagus, measuring >20 mm in diameter, which was                            fractured using cold forceps.                           The Z-line was regular and was found 40 cm from the                            incisors.                           The entire examined stomach was normal.                           The examined duodenum was normal. Complications:            No immediate complications. Estimated Blood Loss:     Estimated blood loss was minimal. Impression:               - Esophageal mucosal changes consistent with                            eosinophilic esophagitis. Biopsies were taken with                            a cold forceps for evaluation of eosinophilic                            esophagitis.                           - There was a subtle, nonobstructing stricture in                            the distal esophagus, measuring >20 mm in diameter,                            which was fractured using cold forceps.                           - Z-line regular, 40 cm from the incisors.                           - Normal stomach.                           - Normal examined duodenum. Recommendation:           - Patient has a contact number available for                            emergencies. The signs and symptoms of potential  delayed complications were discussed with the                            patient. Return to normal activities tomorrow.                            Written discharge instructions were  provided to the                            patient.                           - Resume previous diet today.                           - Continue present medications.                           - Await pathology results.                           - Will communicate pathology results to your                            Allergist when they have resulted. Harry Lindau, MD 08/28/2023 11:04:08 AM

## 2023-08-31 ENCOUNTER — Telehealth: Payer: Self-pay

## 2023-08-31 NOTE — Telephone Encounter (Signed)
  Follow up Call-     08/28/2023    9:55 AM  Call back number  Post procedure Call Back phone  # [phone]  Permission to leave phone message Yes     Patient questions:  Do you have a fever, pain , or abdominal swelling? No. Pain Score  0 *  Have you tolerated food without any problems? Yes.    Have you been able to return to your normal activities? Yes.    Do you have any questions about your discharge instructions: Diet   No. Medications  No. Follow up visit  No.  Do you have questions or concerns about your Care? No.  Actions: * If pain score is 4 or above: No action needed, pain <4.

## 2023-09-01 LAB — SURGICAL PATHOLOGY

## 2023-09-02 ENCOUNTER — Telehealth: Payer: Self-pay | Admitting: *Deleted

## 2023-09-02 ENCOUNTER — Other Ambulatory Visit (HOSPITAL_COMMUNITY): Payer: Self-pay

## 2023-09-02 MED ORDER — DUPIXENT 300 MG/2ML ~~LOC~~ SOSY
300.0000 mg | PREFILLED_SYRINGE | SUBCUTANEOUS | 11 refills | Status: AC
  Filled 2023-09-03: qty 8, 28d supply, fill #0
  Filled 2023-09-24 – 2023-09-28 (×2): qty 8, 28d supply, fill #1
  Filled 2023-10-22: qty 8, 28d supply, fill #2
  Filled 2023-11-23 – 2023-11-26 (×2): qty 8, 28d supply, fill #3
  Filled 2023-12-21 – 2023-12-23 (×2): qty 8, 28d supply, fill #4
  Filled 2024-01-14: qty 8, 28d supply, fill #5
  Filled 2024-02-15: qty 8, 28d supply, fill #6
  Filled 2024-03-08: qty 8, 28d supply, fill #7
  Filled 2024-04-08: qty 8, 28d supply, fill #8

## 2023-09-02 NOTE — Telephone Encounter (Signed)
-----   Message from [facility] sent at 08/21/2023 11:59 AM EDT ----- Has EOE and wants to start Dupixent.  Has had endoscopy but all I can find in the path report is that it says increased eosinophils but does not have a count (that I can find).  He's having another scope next week and hope they list a eos count.  Is that still important for approval or does the path just saying it was elevated enough?

## 2023-09-02 NOTE — Telephone Encounter (Signed)
 Called patient and advised approval and submit for Dupixent to [facility] long for EOE. Advised initial injection in clinic then can home admin

## 2023-09-03 ENCOUNTER — Other Ambulatory Visit: Payer: Self-pay

## 2023-09-03 ENCOUNTER — Other Ambulatory Visit (HOSPITAL_COMMUNITY): Payer: Self-pay

## 2023-09-03 NOTE — Progress Notes (Signed)
 Specialty Pharmacy Initiation Note   Scott Mitchell is a 33 y.o. male who will be followed by the specialty pharmacy service for RxSp Allergy     Review of administration, indication, effectiveness, safety, potential side effects, storage/disposable, and missed dose instructions occurred today for patient's specialty medication(s) Dupilumab (Dupixent)     Patient/Caregiver did not have any additional questions or concerns.   Patient's therapy is appropriate to: Initiate    Goals Addressed             This Visit's Progress    Minimize recurrence of flares       Patient is initiating therapy. Patient will maintain adherence         Rena Carnes Specialty Pharmacist

## 2023-09-03 NOTE — Progress Notes (Signed)
 Specialty Pharmacy Initial Fill Coordination Note  Scott Mitchell is a 33 y.o. male contacted today regarding initial fill of specialty medication(s) Dupilumab (Dupixent)   Patient requested Courier to Provider Office   Delivery date: 09/08/23   Verified address: [address] [place], [address]   Medication will be filled on 06/23.   Patient is aware of $4.00 copayment.   Patient to stop by [facility] Monday 06/23 to put CC on file. Send AR if no CC on file before delivery.   Made patient aware that after training for first dose at the office to ask them for second dose to take home

## 2023-09-07 ENCOUNTER — Other Ambulatory Visit: Payer: Self-pay

## 2023-09-07 ENCOUNTER — Other Ambulatory Visit: Payer: Self-pay | Admitting: Family Medicine

## 2023-09-07 ENCOUNTER — Ambulatory Visit: Payer: Self-pay | Admitting: Gastroenterology

## 2023-09-07 DIAGNOSIS — F988 Other specified behavioral and emotional disorders with onset usually occurring in childhood and adolescence: Secondary | ICD-10-CM

## 2023-09-07 NOTE — Telephone Encounter (Signed)
 PDMP reviewed, no aberrancies. Pt has upcoming follow up scheduled. Refill appropriate

## 2023-09-08 ENCOUNTER — Other Ambulatory Visit: Payer: Self-pay

## 2023-09-10 ENCOUNTER — Ambulatory Visit (INDEPENDENT_AMBULATORY_CARE_PROVIDER_SITE_OTHER)

## 2023-09-10 DIAGNOSIS — K200 Eosinophilic esophagitis: Secondary | ICD-10-CM | POA: Diagnosis not present

## 2023-09-10 MED ORDER — DUPILUMAB 300 MG/2ML ~~LOC~~ SOSY
300.0000 mg | PREFILLED_SYRINGE | SUBCUTANEOUS | Status: AC
  Administered 2023-09-10: 300 mg via SUBCUTANEOUS

## 2023-09-10 NOTE — Progress Notes (Signed)
 Immunotherapy   Patient Details  Name: Scott Mitchell MRN: [id number] Date of Birth: 1991-02-24  09/10/2023  Lonni Pepper started injections for  Dupixent  home administration. Following schedule: Every seven days.  Frequency:1 time per week Epi-Pen:Not needed. Consent signed in office today and patient instructions given on how to properly administer Dupixent  in the thigh and abdomen. Patient successfully administered Dupixent  to himself in his lower left abdomen. Patient waited in room twenty seven for fifteen minutes without an issue.    [place] DELENA Eck 09/10/2023, 9:50 AM

## 2023-09-15 ENCOUNTER — Encounter: Payer: Self-pay | Admitting: Allergy

## 2023-09-23 ENCOUNTER — Ambulatory Visit (INDEPENDENT_AMBULATORY_CARE_PROVIDER_SITE_OTHER)

## 2023-09-23 VITALS — BP 123/83 | HR 94 | Temp 98.0°F | Ht 72.0 in | Wt 240.1 lb

## 2023-09-23 DIAGNOSIS — M545 Low back pain, unspecified: Secondary | ICD-10-CM | POA: Insufficient documentation

## 2023-09-23 DIAGNOSIS — F339 Major depressive disorder, recurrent, unspecified: Secondary | ICD-10-CM

## 2023-09-23 DIAGNOSIS — F988 Other specified behavioral and emotional disorders with onset usually occurring in childhood and adolescence: Secondary | ICD-10-CM | POA: Diagnosis not present

## 2023-09-23 DIAGNOSIS — B354 Tinea corporis: Secondary | ICD-10-CM | POA: Insufficient documentation

## 2023-09-23 MED ORDER — AMPHETAMINE-DEXTROAMPHETAMINE 20 MG PO TABS: 20.0000 mg | ORAL_TABLET | Freq: Every morning | ORAL | 0 refills | Status: AC

## 2023-09-23 MED ORDER — AMPHETAMINE-DEXTROAMPHETAMINE 5 MG PO TABS: 5.0000 mg | ORAL_TABLET | Freq: Every day | ORAL | 0 refills | Status: AC

## 2023-09-23 MED ORDER — AMPHETAMINE-DEXTROAMPHETAMINE 20 MG PO TABS: 20.0000 mg | ORAL_TABLET | ORAL | 0 refills | Status: DC

## 2023-09-23 MED ORDER — AMPHETAMINE-DEXTROAMPHETAMINE 5 MG PO TABS: 5.0000 mg | ORAL_TABLET | Freq: Every day | ORAL | 0 refills | Status: DC

## 2023-09-23 MED ORDER — TERBINAFINE HCL 1 % EX CREA: 1.0000 | TOPICAL_CREAM | Freq: Two times a day (BID) | CUTANEOUS | 0 refills | Status: DC

## 2023-09-23 NOTE — Assessment & Plan Note (Signed)
 Start terbinafine  1% cream applied to the rash under left armpit, twice daily for 2 weeks.  Advised patient to try and keep the area as dry as possible over the next 2 weeks.  Also advised him not to apply deodorant over the area until after the cream has been applied and has appointment time to dry.  Instructed patient to notify if rash gets worse or has not improved in 2 weeks and we can transition him to oral medication instead.

## 2023-09-23 NOTE — Progress Notes (Signed)
 Established Patient Office Visit  Subjective   Patient ID: Scott Mitchell, male    DOB: [birth year]  Age: 33 y.o. MRN: [id number]  Chief Complaint  Patient presents with   Medication Management    HPI  Scott Mitchell is a 33 y.o. y/o male who presents to the clinic today for follow up on ADHD. Patient currently taking 25 mg Adderall daily for ADHD. Reports he takes these medications as needed and takes breaks from the medication on the weekends. Tolerates the medications well. Denies side effects such as palpitations or insomnia.   Also reports that he hurt his lower back a few days ago. Unsure of the mechanism of injury. Denies numbness,tingling, or radiation. Reports that the pain has improved with OTC aleve but is still 4/10 pain scale.   Patient also reports a several week history of a growing, circular, itchy red rash under his left armpit. Reports that he bought some over-the-counter cream but has not used it because he was unsure if the rash was fungal in nature or not. Reports that he does sleep with his dog at night. Denies rash anywhere else on his body, but does endorse that the rash has been getting larger.     ROS Per HPI.    Objective:     BP 123/83   Pulse 94   Temp 98 F (36.7 C) (Oral)   Ht 6' (1.829 m)   Wt 240 lb 1.3 oz (108.9 kg)   SpO2 98%   BMI 32.56 kg/m    Physical Exam Constitutional:      General: He is not in acute distress.    Appearance: Normal appearance.  Cardiovascular:     Rate and Rhythm: Normal rate and regular rhythm.     Heart sounds: Normal heart sounds. No murmur heard.    No friction rub. No gallop.  Pulmonary:     Effort: Pulmonary effort is normal. No respiratory distress.     Breath sounds: Normal breath sounds.  Musculoskeletal:        General: No swelling.  Skin:    General: Skin is warm and dry.     Comments: Approx 4 cm x 4 cm circular, red, scaling rash noted to the center of left armpit.  No swelling or  discharge. No excoriation present.  Neurological:     General: No focal deficit present.     Mental Status: He is alert.  Psychiatric:        Mood and Affect: Mood normal.        Behavior: Behavior normal.        Thought Content: Thought content normal.    No results found for any visits on 09/23/23.    The ASCVD Risk score (Arnett DK, et al., 2019) failed to calculate for the following reasons:   The 2019 ASCVD risk score is only valid for ages 35 to 42    Assessment & Plan:   Depression, recurrent (HCC) Assessment & Plan: GAD-7 score 8, PHQ-9 score 10.  Patient not currently on SSRI therapy.  Declines initiating therapy at this time due to being on SSRIs in the past and reporting that he did not like the way the medication made him feel.  Discussed with patient local resources for therapy, as well as options for virtual therapy.  Patient declined any referrals at this time, but was advised to send a message through MyChart if therapy becomes something that he would be interested in.  Will continue to monitor.  Attention deficit disorder (ADD) without hyperactivity Assessment & Plan: Stable and well-controlled.  Continue Adderall 10 to 20 mg daily mornings as needed with 5 mg tablets to use in the afternoon for breakthrough.  PDMP reviewed, no aberrancies.  Overdose risk score 340.  Adderall recently refilled on 09-07-2023.  2 additional 30-day refill sent to the pharmacy.  Will continue to monitor.  Orders: -     Amphetamine -Dextroamphetamine ; Take 1 tablet (20 mg total) by mouth in the morning.  Dispense: 30 tablet; Refill: 0 -     Amphetamine -Dextroamphetamine ; Take 1 tablet (20 mg total) by mouth every morning.  Dispense: 30 tablet; Refill: 0 -     Amphetamine -Dextroamphetamine ; Take 1 tablet (5 mg total) by mouth daily in the afternoon.  Dispense: 30 tablet; Refill: 0 -     Amphetamine -Dextroamphetamine ; Take 1 tablet (5 mg total) by mouth daily in the afternoon.  Dispense: 30  tablet; Refill: 0  Tinea corporis Assessment & Plan: Start terbinafine  1% cream applied to the rash under left armpit, twice daily for 2 weeks.  Advised patient to try and keep the area as dry as possible over the next 2 weeks.  Also advised him not to apply deodorant over the area until after the cream has been applied and has appointment time to dry.  Instructed patient to notify if rash gets worse or has not improved in 2 weeks and we can transition him to oral medication instead.   Acute midline low back pain without sciatica Assessment & Plan: Stable and improving with over-the-counter Aleve as needed.  Patient declined the need for low-dose muscle relaxer at this time.  Advised patient to notify if back pain has not improved or has worsened in the next 4 to 6 weeks.    Other orders -     Terbinafine  HCl; Apply 1 Application topically 2 (two) times daily.  Dispense: 30 g; Refill: 0   Return in about 3 months (around 12/24/2023) for ADHD, mood.    Saddie JULIANNA Sacks, PA-C

## 2023-09-23 NOTE — Assessment & Plan Note (Signed)
 Stable and well-controlled.  Continue Adderall 10 to 20 mg daily mornings as needed with 5 mg tablets to use in the afternoon for breakthrough.  PDMP reviewed, no aberrancies.  Overdose risk score 340.  Adderall recently refilled on 09-07-2023.  2 additional 30-day refill sent to the pharmacy.  Will continue to monitor.

## 2023-09-23 NOTE — Patient Instructions (Signed)
 It was nice to see you today!  As we discussed in clinic:  -I have sent in the refills for your Adderall 20 mg and 5 mg that can be picked up on 10/07/23 and 11/07/23 or after.  -I have also sent in terbinafine  anti-fungal cream to be applied over the affected area twice daily for 2 weeks. Try to keep the arm pit as dry as possible and don't apply deodorant over the area until after the cream has had plenty of time to dry.  -Please let me know if the rash persists and we can transition to an oral medication instead.  -If virtual therapy or in-person therapy becomes something you think you would be interested in, please let me know and I can place the referral for you! -If back pain persists with the Aleve, send me a MyChart message and we can do a trial of the muscle relaxer we discussed in clinic!   I will plan on seeing you back in 3 months for a follow up! It was nice to meet you!  If you have any problems before your next visit feel free to message me via MyChart (minor issues or questions) or call the office, otherwise you may reach out to schedule an office visit.  Thank you! Saddie Sacks, PA-C

## 2023-09-23 NOTE — Assessment & Plan Note (Signed)
 Stable and improving with over-the-counter Aleve as needed.  Patient declined the need for low-dose muscle relaxer at this time.  Advised patient to notify if back pain has not improved or has worsened in the next 4 to 6 weeks.

## 2023-09-23 NOTE — Assessment & Plan Note (Signed)
 GAD-7 score 8, PHQ-9 score 10.  Patient not currently on SSRI therapy.  Declines initiating therapy at this time due to being on SSRIs in the past and reporting that he did not like the way the medication made him feel.  Discussed with patient local resources for therapy, as well as options for virtual therapy.  Patient declined any referrals at this time, but was advised to send a message through MyChart if therapy becomes something that he would be interested in.  Will continue to monitor.

## 2023-09-24 ENCOUNTER — Other Ambulatory Visit (HOSPITAL_COMMUNITY): Payer: Self-pay

## 2023-09-24 ENCOUNTER — Encounter (INDEPENDENT_AMBULATORY_CARE_PROVIDER_SITE_OTHER): Payer: Self-pay

## 2023-09-24 ENCOUNTER — Other Ambulatory Visit: Payer: Self-pay

## 2023-09-24 NOTE — Progress Notes (Signed)
 Called injection room at [facility]- eventually spoke with Rosina CMA and she stated they DID have a box of Dupixent  with 2 syringes for the patient. Rosina stated that the office will call the patient to set up a pick up time for the patient to receive the other box to administer at home.

## 2023-09-24 NOTE — Progress Notes (Signed)
 Spoke with patient. [place] called [facility] in regards to his next refill of Dupixent . He states that he does not have an injection for today's dose. Patient injects once every 7 days. Patient received 1st dose on 6.26.25 in office. Patient informed me that he was only given one injection to take home. [facility] may have the other 2 doses in office. Insurance will pay for his next refill on 7.14.25. Will find out more information and will follow up with patient at [phone]. Encounter routed to Juliana.

## 2023-09-28 ENCOUNTER — Other Ambulatory Visit (HOSPITAL_COMMUNITY): Payer: Self-pay

## 2023-09-28 ENCOUNTER — Other Ambulatory Visit: Payer: Self-pay

## 2023-09-28 NOTE — Progress Notes (Signed)
 Specialty Pharmacy Refill Coordination Note  MyChart Questionnaire Submission  Scott Mitchell is a 33 y.o. male contacted today regarding refills of specialty medication(s) Dupixent .  Injection date: 10/08/23   Patient requested: Delivery  Delivery date: 09/30/23   Verified address: [address] [place] [address]  Medication will be filled on 09/29/23.

## 2023-10-19 ENCOUNTER — Ambulatory Visit: Payer: Self-pay

## 2023-10-19 NOTE — Telephone Encounter (Signed)
 Contacted pt and appt scheduled.

## 2023-10-19 NOTE — Telephone Encounter (Signed)
 FYI Only or Action Required?: Action required by provider: request for appointment and Pt would like labs ordered for Lyme's disease.  Patient was last seen in primary care on 06/19/2022 by Hanford Powell BRAVO, NP.  Called Nurse Triage reporting Rash some body aches  Symptoms began several months ago.  Interventions attempted: Other: Seen by pcp and treated for ringworm.  Symptoms are: unchanged.  Triage Disposition: See Physician Within 24 Hours  Patient/caregiver understands and will follow disposition?: Yes                     pt wants to know if he can get a sooner appt due to a rash that's still on his arm he wants to be sure its not lime disease.    Reason for Disposition  Lyme disease suspected (e.g., bull's eye rash or tick bite / exposure)  Answer Assessment - Initial Assessment Questions 1. APPEARANCE of RASH: What does the rash look like? (e.g., blisters, dry flaky skin, red spots, redness, sores)     Bulls eye under arm 2. LOCATION: Where is the rash located?      Under arm  3. NUMBER: How many spots are there?      1  Protocols used: Rash or Redness - Localized-A-AH

## 2023-10-20 ENCOUNTER — Other Ambulatory Visit: Payer: Self-pay

## 2023-10-22 ENCOUNTER — Other Ambulatory Visit: Payer: Self-pay

## 2023-10-22 ENCOUNTER — Ambulatory Visit (INDEPENDENT_AMBULATORY_CARE_PROVIDER_SITE_OTHER)

## 2023-10-22 VITALS — BP 118/78 | HR 83 | Temp 98.0°F | Ht 72.0 in | Wt 239.0 lb

## 2023-10-22 DIAGNOSIS — Z6834 Body mass index (BMI) 34.0-34.9, adult: Secondary | ICD-10-CM | POA: Diagnosis not present

## 2023-10-22 DIAGNOSIS — Z6832 Body mass index (BMI) 32.0-32.9, adult: Secondary | ICD-10-CM

## 2023-10-22 DIAGNOSIS — Z1321 Encounter for screening for nutritional disorder: Secondary | ICD-10-CM

## 2023-10-22 DIAGNOSIS — Z8279 Family history of other congenital malformations, deformations and chromosomal abnormalities: Secondary | ICD-10-CM | POA: Insufficient documentation

## 2023-10-22 DIAGNOSIS — R21 Rash and other nonspecific skin eruption: Secondary | ICD-10-CM | POA: Insufficient documentation

## 2023-10-22 MED ORDER — TRIAMCINOLONE ACETONIDE 0.1 % EX CREA: 1.0000 | TOPICAL_CREAM | Freq: Two times a day (BID) | CUTANEOUS | 0 refills | Status: AC

## 2023-10-22 NOTE — Patient Instructions (Signed)
 VISIT SUMMARY: Today, you were seen for a persistent rash and concerns about Lyme disease. We discussed your symptoms, potential causes, and necessary tests and treatments.  YOUR PLAN: CONCERN FOR LYME DISEASE: You have a history of tick exposure and a rash that looks similar Lyme disease. -We will order a Lyme disease blood test. -If the test is positive, we will start you on doxycycline treatment.  INFLAMMATORY SKIN RASH (POSSIBLE NUMMULAR ECZEMA): Your rash is likely inflammatory, possibly nummular eczema, given its appearance and non-response to antifungals. -Apply triamcinolone  cream twice daily for at least two weeks. -If there is no improvement in two weeks and the Lyme test is negative, we may refer you to a dermatologist for a biopsy. -If a referral is needed, we will check if allergist Paget can perform the biopsy.  HYPERCHOLESTEROLEMIA (HISTORY OF, MONITORING): You have a history of fluctuating cholesterol levels and concerns due to diet. -We will order cholesterol blood work during this visit. -Schedule a full physical examination on October 10th, 2025.  SCREENING FOR BICUSPID AORTIC VALVE (FAMILY HISTORY): Due to your family history, we need to screen for bicuspid aortic valve. -We will order an echocardiogram for screening.  If you have any problems before your next visit feel free to message me via MyChart (minor issues or questions) or call the office, otherwise you may reach out to schedule an office visit.  Thank you! Saddie Sacks, PA-C

## 2023-10-22 NOTE — Assessment & Plan Note (Signed)
 Family history of bicuspid aortic valve in direct relative (father) necessitates screening. - Order echocardiogram for screening of bicuspid aortic valve.

## 2023-10-22 NOTE — Progress Notes (Signed)
 Specialty Pharmacy Refill Coordination Note  Scott Mitchell is a 33 y.o. male contacted today regarding refills of specialty medication(s) Dupilumab  (DUPIXENT )   Patient requested Delivery   Delivery date: 10/30/23   Verified address: [address] [place], [place], [address]   Medication will be filled on 10/29/23.

## 2023-10-22 NOTE — Progress Notes (Signed)
 Acute Office Visit  Subjective:     Patient ID: Scott Mitchell, male    DOB: 1990/07/07, 33 y.o.   MRN: [id number]  Chief Complaint  Patient presents with   Rash    Onset:3 months ago Meds: Lamisil , Neosporin, Bactrim, and Medicated Powders     HPI  History of Present Illness Scott Mitchell is a 33 year old male who presents with a persistent rash and concerns about Lyme disease.  Cutaneous rash - Persistent circular rash, more visible with heat exposure (e.g., after showering or yard work) located within the left armpit (picture added to chart, see media) - Intermittently pruritic - No spread to other body areas - No improvement with two weeks of topical terbinafine   Tick exposure and lyme disease concern - [place] tick [place] - Four dogs in the household frequently pick up ticks - Mother diagnosed with alpha-gal syndrome from a tick bite - Concern for Lyme disease due to rash and tick exposure - Flu-like symptoms approximately one month ago, including intermittent headaches and fever - Symptoms resolved after one week - Attributes symptoms to dehydration, but remains concerned about Lyme disease   ROS Per HPI     Objective:    BP 118/78   Pulse 83   Temp 98 F (36.7 C) (Oral)   Ht 6' (1.829 m)   Wt 239 lb 0.6 oz (108.4 kg)   SpO2 98%   BMI 32.42 kg/m    Physical Exam Constitutional:      General: He is not in acute distress.    Appearance: Normal appearance.  HENT:     Right Ear: Tympanic membrane normal.     Left Ear: Tympanic membrane normal.     Mouth/Throat:     Mouth: Mucous membranes are moist.     Pharynx: Oropharynx is clear.  Eyes:     Pupils: Pupils are equal, round, and reactive to light.  Cardiovascular:     Rate and Rhythm: Normal rate and regular rhythm.     Heart sounds: Normal heart sounds. No murmur heard.    No friction rub. No gallop.  Pulmonary:     Effort: Pulmonary  effort is normal. No respiratory distress.     Breath sounds: Normal breath sounds.  Abdominal:     General: Abdomen is flat. Bowel sounds are normal.     Palpations: Abdomen is soft.  Musculoskeletal:        General: No swelling.     Cervical back: Neck supple.  Lymphadenopathy:     Cervical: No cervical adenopathy.  Skin:    General: Skin is warm and dry.  Neurological:     General: No focal deficit present.     Mental Status: He is alert.  Psychiatric:        Mood and Affect: Mood normal.        Behavior: Behavior normal.        Thought Content: Thought content normal.    No results found for any visits on 10/22/23.      Assessment & Plan:   BMI 34.0-34.9,adult -     TSH -     Hemoglobin A1c -     Lipid panel -     Comprehensive metabolic panel with GFR -     CBC with Differential/Platelet  Target rash Assessment & Plan: Rash under left armpit (see media tab for photo). Likely inflammatory, Differential includes nummular eczema and ringworm, but ringworm  is unlikely due to lack of antifungal response. Testing needed to rule out Lyme disease given known tick exposure. - Order Lyme disease blood test. - If positive, initiate doxycycline treatment.Triamcinolone  cream prescribed pending Lyme test results. - Prescribe triamcinolone  0.1% cream, apply twice daily for at least two weeks. - If no improvement in two weeks and Lyme test negative, consider dermatology referral for biopsy. - If referral needed, check if allergist Jeneal can perform biopsy instead of derm.   Orders: -     Lyme Disease Serology w/Reflex  Encounter for vitamin deficiency screening -     VITAMIN D  25 Hydroxy (Vit-D Deficiency, Fractures)  Family history of bicuspid heart valve Assessment & Plan: Family history of bicuspid aortic valve in direct relative (father) necessitates screening. - Order echocardiogram for screening of bicuspid aortic valve.  Orders: -     ECHOCARDIOGRAM COMPLETE;  Future  Other orders -     Triamcinolone  Acetonide; Apply 1 Application topically 2 (two) times daily.  Dispense: 30 g; Refill: 0       Return if symptoms worsen or fail to improve. Will plan for CPE in October, labs today while patient is in clinic.  Saddie JULIANNA Sacks, PA-C

## 2023-10-22 NOTE — Assessment & Plan Note (Signed)
 Rash under left armpit (see media tab for photo). Likely inflammatory, Differential includes nummular eczema and ringworm, but ringworm is unlikely due to lack of antifungal response. Testing needed to rule out Lyme disease given known tick exposure. - Order Lyme disease blood test. - If positive, initiate doxycycline treatment.Triamcinolone  cream prescribed pending Lyme test results. - Prescribe triamcinolone  0.1% cream, apply twice daily for at least two weeks. - If no improvement in two weeks and Lyme test negative, consider dermatology referral for biopsy. - If referral needed, check if allergist Jeneal can perform biopsy instead of derm.

## 2023-10-23 ENCOUNTER — Ambulatory Visit: Payer: Self-pay

## 2023-10-23 LAB — COMPREHENSIVE METABOLIC PANEL WITH GFR
ALT: 24 IU/L (ref 0–44)
AST: 18 IU/L (ref 0–40)
Albumin: 4.9 g/dL (ref 4.1–5.1)
Alkaline Phosphatase: 40 IU/L — ABNORMAL LOW (ref 44–121)
BUN/Creatinine Ratio: 15 (ref 9–20)
BUN: 20 mg/dL (ref 6–20)
Bilirubin Total: 0.7 mg/dL (ref 0.0–1.2)
CO2: 23 mmol/L (ref 20–29)
Calcium: 10 mg/dL (ref 8.7–10.2)
Chloride: 101 mmol/L (ref 96–106)
Creatinine, Ser: 1.31 mg/dL — ABNORMAL HIGH (ref 0.76–1.27)
Globulin, Total: 2.2 g/dL (ref 1.5–4.5)
Glucose: 84 mg/dL (ref 70–99)
Potassium: 4 mmol/L (ref 3.5–5.2)
Sodium: 140 mmol/L (ref 134–144)
Total Protein: 7.1 g/dL (ref 6.0–8.5)
eGFR: 73 mL/min/1.73 (ref >59)

## 2023-10-23 LAB — LYME DISEASE SEROLOGY W/REFLEX: Lyme Total Antibody EIA: NEGATIVE

## 2023-10-23 LAB — TSH: TSH: 2.42 u[IU]/mL (ref 0.450–4.500)

## 2023-10-23 LAB — CBC WITH DIFFERENTIAL/PLATELET
Basophils Absolute: 0.1 x10E3/uL (ref 0.0–0.2)
Basos: 1 %
EOS (ABSOLUTE): 0.4 x10E3/uL (ref 0.0–0.4)
Eos: 6 %
Hematocrit: 46 % (ref 37.5–51.0)
Hemoglobin: 14.8 g/dL (ref 13.0–17.7)
Immature Grans (Abs): 0 x10E3/uL (ref 0.0–0.1)
Immature Granulocytes: 0 %
Lymphocytes Absolute: 1.4 x10E3/uL (ref 0.7–3.1)
Lymphs: 20 %
MCH: 28.6 pg (ref 26.6–33.0)
MCHC: 32.2 g/dL (ref 31.5–35.7)
MCV: 89 fL (ref 79–97)
Monocytes Absolute: 0.5 x10E3/uL (ref 0.1–0.9)
Monocytes: 7 %
Neutrophils Absolute: 4.6 x10E3/uL (ref 1.4–7.0)
Neutrophils: 66 %
Platelets: 167 x10E3/uL (ref 150–450)
RBC: 5.17 x10E6/uL (ref 4.14–5.80)
RDW: 13.3 % (ref 11.6–15.4)
WBC: 7 x10E3/uL (ref 3.4–10.8)

## 2023-10-23 LAB — HEMOGLOBIN A1C
Est. average glucose Bld gHb Est-mCnc: 105 mg/dL
Hgb A1c MFr Bld: 5.3 % (ref 4.8–5.6)

## 2023-10-23 LAB — LIPID PANEL
Chol/HDL Ratio: 6.9 ratio — ABNORMAL HIGH (ref 0.0–5.0)
Cholesterol, Total: 199 mg/dL (ref 100–199)
HDL: 29 mg/dL — ABNORMAL LOW (ref >39)
LDL Chol Calc (NIH): 130 mg/dL — ABNORMAL HIGH (ref 0–99)
Triglycerides: 220 mg/dL — ABNORMAL HIGH (ref 0–149)
VLDL Cholesterol Cal: 40 mg/dL (ref 5–40)

## 2023-10-23 LAB — VITAMIN D 25 HYDROXY (VIT D DEFICIENCY, FRACTURES): Vit D, 25-Hydroxy: 40.9 ng/mL (ref 30.0–100.0)

## 2023-10-29 ENCOUNTER — Other Ambulatory Visit: Payer: Self-pay

## 2023-11-19 ENCOUNTER — Other Ambulatory Visit: Payer: Self-pay

## 2023-11-19 ENCOUNTER — Ambulatory Visit (INDEPENDENT_AMBULATORY_CARE_PROVIDER_SITE_OTHER)

## 2023-11-19 DIAGNOSIS — Z8279 Family history of other congenital malformations, deformations and chromosomal abnormalities: Secondary | ICD-10-CM

## 2023-11-20 LAB — ECHOCARDIOGRAM COMPLETE
Area-P 1/2: 3.48 cm2
S' Lateral: 3.15 cm

## 2023-11-23 ENCOUNTER — Other Ambulatory Visit: Payer: Self-pay

## 2023-11-25 ENCOUNTER — Other Ambulatory Visit: Payer: Self-pay

## 2023-11-26 ENCOUNTER — Other Ambulatory Visit: Payer: Self-pay | Admitting: Pharmacy Technician

## 2023-11-26 ENCOUNTER — Other Ambulatory Visit: Payer: Self-pay

## 2023-11-26 NOTE — Progress Notes (Signed)
 Specialty Pharmacy Refill Coordination Note  Scott Mitchell is a 32 y.o. male contacted today regarding refills of specialty medication(s) Dupilumab  (DUPIXENT )   Patient requested Delivery   Delivery date: 11/27/23   Verified address: [address]  [place]   Medication will be filled on 11/26/23.

## 2023-12-09 ENCOUNTER — Other Ambulatory Visit: Payer: Self-pay

## 2023-12-09 ENCOUNTER — Ambulatory Visit (INDEPENDENT_AMBULATORY_CARE_PROVIDER_SITE_OTHER): Admitting: Allergy

## 2023-12-09 VITALS — BP 120/74 | HR 112 | Temp 98.1°F | Resp 18 | Ht 72.0 in | Wt 244.1 lb

## 2023-12-09 DIAGNOSIS — K200 Eosinophilic esophagitis: Secondary | ICD-10-CM

## 2023-12-09 NOTE — Progress Notes (Unsigned)
 Follow-up Note  RE: Scott Mitchell MRN: [id number] DOB: March 04, 1991 Date of Office Visit: 12/09/2023   History of present illness: Scott Mitchell is a 33 y.o. male presenting today for follow-up of EOE.  He was last seen in the office on 08/21/2023 by myself. He has not had any new health changes, surgeries or hospitalizations since this visit. He was started on Dupixent  for EOE management and is doing self-administration of Dupixent  once a week.  Since our Dupixent  he has noticed that improvement in eating in general.  He has less of a concern in regards to that choking or food impaction now that he is on Dupixent .  Thus far he is happy with the results he has seen thus far in regards to Dupixent .  He is no longer on pantoprazole.  He is just on Dupixent  for his EOE management.  He did not start the swallowed Flovent .  He has not needed to see his GI in follow-up at this time.  Review of systems: 10pt ROS negative unless noted above in HPI  Past medical/social/surgical/family history have been reviewed and are unchanged unless specifically indicated below.  No changes  Medication List: Current Outpatient Medications  Medication Sig Dispense Refill   amphetamine -dextroamphetamine  (ADDERALL) 20 MG tablet TAKE 1 TABLET (20 MG TOTAL) BY MOUTH EVERY MORNING. EFFECTIVE 06/01/2023 30 tablet 0   amphetamine -dextroamphetamine  (ADDERALL) 20 MG tablet Take 1 tablet (20 mg total) by mouth in the morning. 30 tablet 0   amphetamine -dextroamphetamine  (ADDERALL) 20 MG tablet Take 1 tablet (20 mg total) by mouth every morning. 30 tablet 0   amphetamine -dextroamphetamine  (ADDERALL) 5 MG tablet TAKE 1 TABLET (5 MG TOTAL) BY MOUTH DAILY IN THE AFTERNOON. EFFECTIVE 05/02/2023 30 tablet 0   amphetamine -dextroamphetamine  (ADDERALL) 5 MG tablet Take 1 tablet (5 mg total) by mouth daily in the afternoon. 30 tablet 0   amphetamine -dextroamphetamine  (ADDERALL) 5 MG tablet Take 1 tablet (5 mg total) by mouth  daily in the afternoon. 30 tablet 0   dupilumab  (DUPIXENT ) 300 MG/2ML prefilled syringe Inject 300 mg into the skin every 7 (seven) days. 8 mL 11   SUMAtriptan  (IMITREX ) 50 MG tablet Take 1 tablet (50 mg total) by mouth every 2 (two) hours as needed for migraine. May repeat in 2 hours if headache persists or recurs. 10 tablet 2   triamcinolone  cream (KENALOG ) 0.1 % Apply 1 Application topically 2 (two) times daily. 30 g 0   Current Facility-Administered Medications  Medication Dose Route Frequency Provider Last Rate Last Admin   dupilumab  (DUPIXENT ) prefilled syringe 300 mg  300 mg Subcutaneous Weekly Jeneal Danita Macintosh, MD   300 mg at 09/10/23 9040     Known medication allergies: No Known Allergies   Physical examination: Blood pressure 120/74, pulse (!) 112, temperature 98.1 F (36.7 C), temperature source Temporal, resp. rate 18, height 6' (1.829 m), weight 244 lb 1.6 oz (110.7 kg), SpO2 95%.  General: Alert, interactive, in no acute distress. HEENT: PERRLA, TMs pearly gray, turbinates non-edematous without discharge, post-pharynx non erythematous. Neck: Supple without lymphadenopathy. Lungs: Clear to auscultation without wheezing, rhonchi or rales. {no increased work of breathing. CV: Normal S1, S2 without murmurs. Abdomen: Nondistended, nontender. Skin: Warm and dry, without lesions or rashes. Extremities:  No clubbing, cyanosis or edema. Neuro:   Grossly intact.  Diagnostics/Labs: None today  Assessment and plan: Eosinophilic esophagitis (EOE) Ezell has concomitant seasonal allergic rhinitis.   Food skin testing was positive to rice and negative to beef.   Avoid  rice in diet.   Environmental allergy  skin testing was positive to grasses, trees, weeds, molds, dust mite, cat and cockroach.  Continue avoidance measures.  Airway symptoms can be more prevalent during exposure to environmental allergens.    Continue Dupixent  injections weekly for EOE management.     Follow up with your gastroenterologist as scheduled for monitoring.  Routine follow-up in 6 months  I appreciate the opportunity to take part in Keven's care. Please do not hesitate to contact me with questions.  Sincerely,   Danita Brain, MD Allergy /Immunology [facility] 

## 2023-12-09 NOTE — Patient Instructions (Signed)
 Eosinophilic esophagitis (EOE) Scott Mitchell has concomitant seasonal allergic rhinitis.     Food skin testing is positive to rice and negative to beef.   Avoid rice in diet.   Environmental allergy skin testing is positive to grasses, trees, weeds, molds, dust mite, cat and cockroach.  Avoidance measures provided.  You can have more EOE symptoms during pollen season with this exposure.   Continue pantoprazole as directed. After your upcoming scope recommend start swallowed Flovent  220mcg 2 puffs swallowed twice a day.  Do not eat or drink for 30 minutes after taking this medication We will plan to start Dupixent injections for EOE management.  This is done once a week and can be self-administered.  Let me know after your scope is done and will notify our coordinator, Tammy, who does the approval process to Dupixent as will need the eosinophil count from your upcoming endoscopy to accompany to the prescription.    Follow up with your gastroenterologist as scheduled for monitoring.  Routine follow-up in 3 to 4 months

## 2023-12-11 ENCOUNTER — Encounter: Payer: Self-pay | Admitting: Allergy

## 2023-12-11 ENCOUNTER — Other Ambulatory Visit: Payer: Self-pay

## 2023-12-11 NOTE — Progress Notes (Signed)
 Patient called regarding dupixent  injection. Patient just used an injection and is reporting a defective device. Per patient the needle did not feel smooth entering and like it was almost barbed. Advised patient reach out to manufacture to report defective device and to request a replacement.

## 2023-12-21 ENCOUNTER — Other Ambulatory Visit: Payer: Self-pay

## 2023-12-23 ENCOUNTER — Other Ambulatory Visit: Payer: Self-pay

## 2023-12-23 NOTE — Progress Notes (Signed)
 Specialty Pharmacy Refill Coordination Note  Scott Mitchell is a 33 y.o. male contacted today regarding refills of specialty medication(s) Dupilumab  (DUPIXENT )   Patient requested Delivery   Delivery date: 12/25/23   Verified address: [address]  [place]   Medication will be filled on 12/24/23.

## 2023-12-24 ENCOUNTER — Other Ambulatory Visit: Payer: Self-pay

## 2023-12-24 ENCOUNTER — Encounter

## 2023-12-31 ENCOUNTER — Encounter

## 2024-01-14 ENCOUNTER — Other Ambulatory Visit (HOSPITAL_COMMUNITY): Payer: Self-pay

## 2024-01-21 ENCOUNTER — Ambulatory Visit (INDEPENDENT_AMBULATORY_CARE_PROVIDER_SITE_OTHER)

## 2024-01-21 VITALS — BP 103/70 | HR 95 | Temp 98.3°F | Ht 72.0 in | Wt 239.0 lb

## 2024-01-21 DIAGNOSIS — F988 Other specified behavioral and emotional disorders with onset usually occurring in childhood and adolescence: Secondary | ICD-10-CM

## 2024-01-21 DIAGNOSIS — M25511 Pain in right shoulder: Secondary | ICD-10-CM

## 2024-01-21 DIAGNOSIS — Z8279 Family history of other congenital malformations, deformations and chromosomal abnormalities: Secondary | ICD-10-CM

## 2024-01-21 DIAGNOSIS — F339 Major depressive disorder, recurrent, unspecified: Secondary | ICD-10-CM | POA: Diagnosis not present

## 2024-01-21 DIAGNOSIS — G8929 Other chronic pain: Secondary | ICD-10-CM | POA: Insufficient documentation

## 2024-01-21 DIAGNOSIS — E785 Hyperlipidemia, unspecified: Secondary | ICD-10-CM | POA: Insufficient documentation

## 2024-01-21 DIAGNOSIS — E782 Mixed hyperlipidemia: Secondary | ICD-10-CM | POA: Diagnosis not present

## 2024-01-21 DIAGNOSIS — Z23 Encounter for immunization: Secondary | ICD-10-CM | POA: Diagnosis not present

## 2024-01-21 DIAGNOSIS — G43909 Migraine, unspecified, not intractable, without status migrainosus: Secondary | ICD-10-CM | POA: Diagnosis not present

## 2024-01-21 DIAGNOSIS — Z0000 Encounter for general adult medical examination without abnormal findings: Secondary | ICD-10-CM | POA: Diagnosis not present

## 2024-01-21 MED ORDER — AMPHETAMINE-DEXTROAMPHETAMINE 20 MG PO TABS: 20.0000 mg | ORAL_TABLET | Freq: Every day | ORAL | 0 refills | Status: DC

## 2024-01-21 MED ORDER — MELOXICAM 15 MG PO TABS: 15.0000 mg | ORAL_TABLET | Freq: Every day | ORAL | 0 refills | Status: AC

## 2024-01-21 MED ORDER — AMPHETAMINE-DEXTROAMPHETAMINE 5 MG PO TABS: 5.0000 mg | ORAL_TABLET | Freq: Every day | ORAL | 0 refills | Status: AC

## 2024-01-21 NOTE — Assessment & Plan Note (Signed)
 Family history of bicuspid aortic valve in direct relative (father).  We discussed that this necessitates screening and he had an echocardiogram of his heart which revealed normal structure and function of biventricular heart valves.

## 2024-01-21 NOTE — Assessment & Plan Note (Signed)
 Last lipid panel: LDL 130, HDL 29, triglycerides 220.  Discussed that now that he is exercising regularly, we can plan to recheck fasting blood work in 3 to 4 months to ensure that levels are trending down.  Continue low-fat diet to prevent worsening of triglycerides/LDL.

## 2024-01-21 NOTE — Assessment & Plan Note (Signed)
 Stable and well-controlled.  Continue Adderall 10 to 20 mg daily mornings as needed with 5 mg tablets to use in the afternoon for breakthrough.  PDMP reviewed, no aberrancies.  Overdose risk score 340.

## 2024-01-21 NOTE — Assessment & Plan Note (Signed)
 Previous x-ray in 2024 revealed mild joint space narrowing.  Patient did benefit from steroid injection done by [facility]. Prefers to avoid permanent interventions. - Prescribed meloxicam as needed for pain. - Provided [facility] contact for potential steroid injection.

## 2024-01-21 NOTE — Patient Instructions (Signed)
 VISIT SUMMARY: Today, you had a routine physical exam and discussed your chronic shoulder pain, ADHD symptoms, headaches, mood symptoms, and other health concerns. Your blood pressure is controlled, and your echocardiogram is normal. You received a flu shot and scheduled a follow-up for February.  YOUR PLAN: CHRONIC SHOULDER PAIN: You have chronic right shoulder pain due to arthritis, which limits your range of motion. -Take meloxicam as needed for pain. -Contact [facility] for a potential steroid injection if needed.  ADHD: Your ADHD is managed with Adderall, and you occasionally experience sleep disturbances. -Continue taking Adderall 20 mg in the morning and 5 mg in the afternoon as needed. -Refilled your Adderall prescriptions.  HEADACHES: You manage your headaches with sumatriptan , which is effective and used sparingly. -Continue using sumatriptan  as needed for headaches.  MOOD SYMPTOMS: You experience symptoms of depression and anxiety but prefer not to engage in therapy. -Continue current management and monitor for any changes.  HYPERTRIGLYCERIDEMIA: Your triglycerides are slightly elevated, but your LDL is well-managed. -Recheck your cholesterol panel in six months.  ELEVATED CREATININE: Your previous labs showed slightly elevated creatinine, likely due to dehydration. -No immediate action is needed unless you develop symptoms.  GENERAL HEALTH MAINTENANCE: Routine health maintenance and preventive care. -You received a flu shot today. -Your blood pressure is controlled, and your echocardiogram is normal. -Your tetanus vaccination is current. -You have no family history of colon cancer. -You have a regular exercise routine. -Scheduled a follow-up in February for lab work and ADHD medication review.  If you have any problems before your next visit feel free to message me via MyChart (minor issues or questions) or call the office, otherwise you may reach out to schedule an office  visit.  Thank you! Saddie Sacks, PA-C

## 2024-01-21 NOTE — Assessment & Plan Note (Signed)
Stable.  Insurance preference for sumatriptan, continue sumatriptan 50 mg for abortive therapy.  Will continue to monitor.

## 2024-01-21 NOTE — Assessment & Plan Note (Signed)
 Stable/improved from previous. Patient not currently on SSRI therapy. Declines initiating therapy at this time due to being on SSRIs in the past and reporting that he did not like the way the medication made him feel. Discussed with patient local resources for therapy, as well as options for virtual therapy. Patient declined any referrals at this time, but was advised to send a message through MyChart if therapy becomes something that he would be interested in. Will continue to monitor.

## 2024-01-21 NOTE — Progress Notes (Signed)
 Complete physical exam  Patient: Scott Mitchell   DOB: 02-15-1991   33 y.o. Male  MRN: [id number]  Subjective:    Chief Complaint  Patient presents with   Medication Management    History of Present Illness   Scott Mitchell is a 33 year old male who presents for a routine physical exam and evaluation of chronic shoulder pain.  Chronic shoulder pain - Intermittent  right shoulder pain for several years - Pain described as a 'twinge' in range of motion, especially when moving from side to forward - Accompanied by a tight, achy sensation - Has worsened since he has recently started working out at the gym  - No sharp pain or sensation of tearing - Previous x-ray showed mild arthritic changes - Received prior steroid injection with temporary relief  Attention deficit hyperactivity disorder (adhd) symptoms and medication effects - Currently taking Adderall immediate release 20 mg in the mornings and 5 mg in the afternoons as needed, primarily on workdays - Rarely uses the 5 mg afternoon dose - Occasional difficulty sleeping when taking Adderall in the afternoons, but not on days he abstains  Headache management - Uses sumatriptan  as needed for headaches - Sumatriptan  is effective and used infrequently  Mood symptoms - Experiences symptoms of depression and anxiety - Managing symptoms independently without therapy due to personal preferences and logistical barriers          Most recent fall risk assessment:    01/21/2024    1:29 PM  Fall Risk   Falls in the past year? 0  Follow up Falls evaluation completed     Most recent depression screenings:    01/21/2024    1:30 PM 10/22/2023   10:20 AM  PHQ 2/9 Scores  PHQ - 2 Score 1 0  PHQ- 9 Score 7     Dental: No current dental problems and Receives regular dental care    Patient Care Team: Gayle Saddie JULIANNA DEVONNA as PCP - General (Physician Assistant) Jeneal Danita Macintosh, MD as Consulting Physician  (Allergy )   Outpatient Medications Prior to Visit  Medication Sig   amphetamine -dextroamphetamine  (ADDERALL) 20 MG tablet Take 1 tablet (20 mg total) by mouth in the morning.   amphetamine -dextroamphetamine  (ADDERALL) 5 MG tablet Take 1 tablet (5 mg total) by mouth daily in the afternoon.   dupilumab  (DUPIXENT ) 300 MG/2ML prefilled syringe Inject 300 mg into the skin every 7 (seven) days.   SUMAtriptan  (IMITREX ) 50 MG tablet Take 1 tablet (50 mg total) by mouth every 2 (two) hours as needed for migraine. May repeat in 2 hours if headache persists or recurs.   triamcinolone  cream (KENALOG ) 0.1 % Apply 1 Application topically 2 (two) times daily.   [DISCONTINUED] amphetamine -dextroamphetamine  (ADDERALL) 20 MG tablet TAKE 1 TABLET (20 MG TOTAL) BY MOUTH EVERY MORNING. EFFECTIVE 06/01/2023   [DISCONTINUED] amphetamine -dextroamphetamine  (ADDERALL) 5 MG tablet TAKE 1 TABLET (5 MG TOTAL) BY MOUTH DAILY IN THE AFTERNOON. EFFECTIVE 05/02/2023   amphetamine -dextroamphetamine  (ADDERALL) 20 MG tablet Take 1 tablet (20 mg total) by mouth every morning. (Patient not taking: Reported on 01/21/2024)   amphetamine -dextroamphetamine  (ADDERALL) 5 MG tablet Take 1 tablet (5 mg total) by mouth daily in the afternoon. (Patient not taking: Reported on 01/21/2024)   Facility-Administered Medications Prior to Visit  Medication Dose Route Frequency Provider   dupilumab  (DUPIXENT ) prefilled syringe 300 mg  300 mg Subcutaneous Weekly Padgett, Danita Macintosh, MD    ROS   Per HPI     Objective:  BP 103/70   Pulse 95   Temp 98.3 F (36.8 C) (Oral)   Ht 6' (1.829 m)   Wt 239 lb (108.4 kg)   SpO2 98%   BMI 32.41 kg/m    Physical Exam Constitutional:      General: He is not in acute distress.    Appearance: Normal appearance.  Cardiovascular:     Rate and Rhythm: Normal rate and regular rhythm.     Heart sounds: Normal heart sounds. No murmur heard.    No friction rub. No gallop.  Pulmonary:      Effort: Pulmonary effort is normal. No respiratory distress.     Breath sounds: Normal breath sounds.  Musculoskeletal:        General: No swelling.     Right shoulder: Bony tenderness (Over AC joint) present. Normal range of motion.     Left shoulder: Normal.     Cervical back: Neck supple.  Lymphadenopathy:     Cervical: No cervical adenopathy.  Skin:    General: Skin is warm and dry.  Neurological:     General: No focal deficit present.     Mental Status: He is alert.  Psychiatric:        Mood and Affect: Mood normal.        Behavior: Behavior normal.        Thought Content: Thought content normal.       No results found for any visits on 01/21/24. Last CBC Lab Results  Component Value Date   WBC 7.0 10/22/2023   HGB 14.8 10/22/2023   HCT 46.0 10/22/2023   MCV 89 10/22/2023   MCH 28.6 10/22/2023   RDW 13.3 10/22/2023   PLT 167 10/22/2023   Last metabolic panel Lab Results  Component Value Date   GLUCOSE 84 10/22/2023   NA 140 10/22/2023   K 4.0 10/22/2023   CL 101 10/22/2023   CO2 23 10/22/2023   BUN 20 10/22/2023   CREATININE 1.31 (H) 10/22/2023   EGFR 73 10/22/2023   CALCIUM 10.0 10/22/2023   PROT 7.1 10/22/2023   ALBUMIN 4.9 10/22/2023   LABGLOB 2.2 10/22/2023   AGRATIO 2.2 08/02/2021   BILITOT 0.7 10/22/2023   ALKPHOS 40 (L) 10/22/2023   AST 18 10/22/2023   ALT 24 10/22/2023   Last lipids Lab Results  Component Value Date   CHOL 199 10/22/2023   HDL 29 (L) 10/22/2023   LDLCALC 130 (H) 10/22/2023   TRIG 220 (H) 10/22/2023   CHOLHDL 6.9 (H) 10/22/2023   Last hemoglobin A1c Lab Results  Component Value Date   HGBA1C 5.3 10/22/2023   Last thyroid  functions Lab Results  Component Value Date   TSH 2.420 10/22/2023   Last vitamin D  Lab Results  Component Value Date   VD25OH 40.9 10/22/2023        Assessment & Plan:    Routine Health Maintenance and Physical Exam  Health Maintenance  Topic Date Due   Hepatitis C Screening  Never  done   COVID-19 Vaccine (3 - 2025-26 season) 02/06/2024*   DTaP/Tdap/Td vaccine (2 - Td or Tdap) 07/08/2027   Flu Shot  Completed   HIV Screening  Completed   Pneumococcal Vaccine  Aged Out   Meningitis B Vaccine  Aged Out   Hepatitis B Vaccine  Discontinued   HPV Vaccine  Discontinued  *Topic was postponed. The date shown is not the original due date.    Discussed health benefits of physical activity, and encouraged him to  engage in regular exercise appropriate for his age and condition.  Wellness examination  Attention deficit disorder (ADD) without hyperactivity Assessment & Plan: Stable and well-controlled.  Continue Adderall 10 to 20 mg daily mornings as needed with 5 mg tablets to use in the afternoon for breakthrough.  PDMP reviewed, no aberrancies.  Overdose risk score 340.   Orders: -     Amphetamine -Dextroamphetamine ; Take 1 tablet (5 mg total) by mouth daily in the afternoon.  Dispense: 30 tablet; Refill: 0 -     Amphetamine -Dextroamphetamine ; Take 1 tablet (20 mg total) by mouth daily.  Dispense: 30 tablet; Refill: 0  Encounter for vaccination -     Flu vaccine trivalent PF, 6mos and older(Flulaval,Afluria,Fluarix,Fluzone)  Acute migraine Assessment & Plan: Stable. Insurance preference for sumatriptan , continue sumatriptan  50 mg for abortive therapy. Will continue to monitor.    Depression, recurrent Assessment & Plan: Stable/improved from previous. Patient not currently on SSRI therapy. Declines initiating therapy at this time due to being on SSRIs in the past and reporting that he did not like the way the medication made him feel. Discussed with patient local resources for therapy, as well as options for virtual therapy. Patient declined any referrals at this time, but was advised to send a message through MyChart if therapy becomes something that he would be interested in. Will continue to monitor.    Family history of bicuspid heart valve Assessment &  Plan: Family history of bicuspid aortic valve in direct relative (father).  We discussed that this necessitates screening and he had an echocardiogram of his heart which revealed normal structure and function of biventricular heart valves.   Chronic right shoulder pain Assessment & Plan: Previous x-ray in 2024 revealed mild joint space narrowing.  Patient did benefit from steroid injection done by [facility]. Prefers to avoid permanent interventions. - Prescribed meloxicam as needed for pain. - Provided [facility] contact for potential steroid injection.   Mixed hyperlipidemia Assessment & Plan: Last lipid panel: LDL 130, HDL 29, triglycerides 220.  Discussed that now that he is exercising regularly, we can plan to recheck fasting blood work in 3 to 4 months to ensure that levels are trending down.  Continue low-fat diet to prevent worsening of triglycerides/LDL.    Other orders -     Meloxicam; Take 1 tablet (15 mg total) by mouth daily.  Dispense: 30 each; Refill: 0   Return in about 3 months (around 04/22/2024) for Mood, ADHD.     Saddie JULIANNA Sacks, PA-C

## 2024-01-22 ENCOUNTER — Other Ambulatory Visit: Payer: Self-pay

## 2024-01-22 NOTE — Progress Notes (Signed)
 Specialty Pharmacy Refill Coordination Note  Scott Mitchell is a 33 y.o. male contacted today regarding refills of specialty medication(s) Dupilumab  (DUPIXENT )   Patient requested Delivery   Delivery date: 01/26/24   Verified address: [address]  [place]   Medication will be filled on: 01/25/24

## 2024-01-25 ENCOUNTER — Other Ambulatory Visit: Payer: Self-pay

## 2024-02-15 ENCOUNTER — Other Ambulatory Visit: Payer: Self-pay

## 2024-02-17 ENCOUNTER — Other Ambulatory Visit: Payer: Self-pay

## 2024-02-17 ENCOUNTER — Other Ambulatory Visit (HOSPITAL_COMMUNITY): Payer: Self-pay

## 2024-02-17 NOTE — Progress Notes (Signed)
 Specialty Pharmacy Refill Coordination Note  Scott Mitchell is a 33 y.o. male contacted today regarding refills of specialty medication(s) Dupilumab  (DUPIXENT )   Patient requested Delivery   Delivery date: 02/19/24   Verified address: [address]  [place]   Medication will be filled on: 02/18/24

## 2024-03-08 ENCOUNTER — Other Ambulatory Visit: Payer: Self-pay

## 2024-03-11 ENCOUNTER — Other Ambulatory Visit (HOSPITAL_COMMUNITY): Payer: Self-pay

## 2024-03-15 ENCOUNTER — Other Ambulatory Visit (HOSPITAL_COMMUNITY): Payer: Self-pay

## 2024-03-18 ENCOUNTER — Other Ambulatory Visit: Payer: Self-pay

## 2024-03-18 NOTE — Progress Notes (Signed)
 Specialty Pharmacy Refill Coordination Note  Scott Mitchell is a 34 y.o. male contacted today regarding refills of specialty medication(s) Dupilumab  (DUPIXENT )   Patient requested Delivery   Delivery date: 03/22/24   Verified address: [address]  [place]   Medication will be filled on: 03/21/24

## 2024-03-21 ENCOUNTER — Other Ambulatory Visit: Payer: Self-pay

## 2024-03-24 ENCOUNTER — Other Ambulatory Visit: Payer: Self-pay

## 2024-04-08 ENCOUNTER — Other Ambulatory Visit: Payer: Self-pay

## 2024-04-12 ENCOUNTER — Other Ambulatory Visit: Payer: Self-pay

## 2024-04-12 ENCOUNTER — Other Ambulatory Visit: Payer: Self-pay | Admitting: Pharmacy Technician

## 2024-04-12 NOTE — Progress Notes (Signed)
 Specialty Pharmacy Refill Coordination Note  Scott Mitchell is a 34 y.o. male contacted today regarding refills of specialty medication(s) Dupilumab  (DUPIXENT )   Patient requested Delivery   Delivery date: 04/15/24   Verified address: [address]  [place]   Medication will be filled on: 04/14/24

## 2024-04-13 ENCOUNTER — Other Ambulatory Visit: Payer: Self-pay

## 2024-04-13 DIAGNOSIS — F988 Other specified behavioral and emotional disorders with onset usually occurring in childhood and adolescence: Secondary | ICD-10-CM

## 2024-04-14 ENCOUNTER — Other Ambulatory Visit: Payer: Self-pay

## 2024-04-25 ENCOUNTER — Ambulatory Visit

## 2024-06-08 ENCOUNTER — Ambulatory Visit: Admitting: Allergy
